# Patient Record
Sex: Male | Born: 2000 | Hispanic: No | Marital: Single | Smoking: Never smoker
Health system: Southern US, Community
[De-identification: ages and names within clinical notes are randomized; demographics above are authoritative.]

## PROBLEM LIST (undated history)

## (undated) DIAGNOSIS — L4050 Arthropathic psoriasis, unspecified: Secondary | ICD-10-CM

## (undated) HISTORY — DX: Arthropathic psoriasis, unspecified: L40.50

## (undated) HISTORY — PX: WISDOM TOOTH EXTRACTION: SHX21

---
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
## 2021-02-26 ENCOUNTER — Ambulatory Visit (HOSPITAL_COMMUNITY)
Admission: EM | Admit: 2021-02-26 | Discharge: 2021-02-26 | Disposition: A | Discharge status: Home / Self Care | Payer: Self-pay

## 2021-02-26 ENCOUNTER — Other Ambulatory Visit: Payer: Self-pay

## 2021-04-20 ENCOUNTER — Other Ambulatory Visit: Payer: Self-pay

## 2021-04-20 ENCOUNTER — Ambulatory Visit (HOSPITAL_COMMUNITY)
Admission: EM | Admit: 2021-04-20 | Discharge: 2021-04-20 | Disposition: A | Discharge status: Home / Self Care | Payer: Self-pay | Attending: Family Medicine | Admitting: Family Medicine

## 2021-04-20 ENCOUNTER — Encounter (HOSPITAL_COMMUNITY): Payer: Self-pay

## 2021-04-20 DIAGNOSIS — M7741 Metatarsalgia, right foot: Secondary | ICD-10-CM

## 2021-04-20 MED ORDER — MELOXICAM 15 MG PO TABS: 15.0000 mg | ORAL_TABLET | Freq: Every day | ORAL | 0 refills | Status: DC

## 2021-04-20 NOTE — ED Triage Notes (Signed)
Pt presents for right foot toe pain.  ?

## 2021-04-20 NOTE — ED Provider Notes (Signed)
?[facility] ? ? ? ?CSN: [id number] ?Arrival date & time: 04/20/21  1200 ? ? ?  ? ?History   ?Chief Complaint ?Chief Complaint  ?Patient presents with  ? Toe Pain  ? ? ?HPI ?Jaylon Boylen is a 21 y.o. male.  ? ?Right foot pain ?Started a few months ago ?Insidious onset ?No particular injury ?States that the pain is coming gone, but has worsened recently ?Denies any notable swelling or redness ?No fevers ?Otherwise in his normal state of health ?Mostly wears boots to work ?Pain is mostly on the plantar aspect of his foot near the second toe ?Has never had anything like this before ? ? ?History reviewed. No pertinent past medical history. ? ?Patient Active Problem List  ? Diagnosis Date Noted  ? 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 Acne vulgaris 08/21/2014  ? Nevus of face 08/21/2014  ? ? ?History reviewed. No pertinent surgical history. ? ? ? ? ?Home Medications   ? ?Prior to Admission medications   ?Medication Sig Start Date End Date Taking? Authorizing Provider  ?meloxicam (MOBIC) 15 MG tablet Take 1 tablet (15 mg total) by mouth daily. 04/20/21  Yes Calandra Madura, Bernita Raisin, DO  ?adapalene (DIFFERIN) 0.1 % cream Apply topically at bedtime. ?Patient not taking: Reported on 04/03/2016 03/16/16   Ettefagh, Paul Dykes, MD  ? ? ?Family History ?History reviewed. No pertinent family history. ? ?Social History ?Social History  ? ?Tobacco Use  ? Smokeless tobacco: Never  ?Substance Use Topics  ? Alcohol use: Not Currently  ?  Alcohol/week: 0.0 standard drinks  ? Drug use: Not Currently  ? ? ? ?Allergies   ?Patient has no known allergies. ? ? ?Review of Systems ?Review of Systems  ?All other systems reviewed and are negative. ?Per HPI ? ?Physical Exam ?Triage Vital Signs ?ED Triage Vitals [04/20/21 1224]  ?Enc Vitals Group  ?   BP 126/76  ?   Pulse Rate (!) 110  ?   Resp 16  ?   Temp (!) 97.4 ?F (36.3 ?C)  ?   Temp Source Oral  ?   SpO2 97 %  ?   Weight   ?   Height   ?   Head Circumference   ?   Peak Flow    ?   Pain Score   ?   Pain Loc   ?   Pain Edu?   ?   Excl. in [place]?   ? ?No data found. ? ?Updated Vital Signs ?BP 126/76 (BP Location: Left Arm)   Pulse (!) 110   Temp (!) 97.4 ?F (36.3 ?C) (Oral)   Resp 16   SpO2 97%  ? ?Visual Acuity ?Right Eye Distance:   ?Left Eye Distance:   ?Bilateral Distance:   ? ?Right Eye Near:   ?Left Eye Near:    ?Bilateral Near:    ? ?Physical Exam ?Constitutional:   ?   General: He is not in acute distress. ?   Appearance: Normal appearance. He is not ill-appearing.  ?HENT:  ?   Head: Normocephalic and atraumatic.  ?Eyes:  ?   Conjunctiva/sclera: Conjunctivae normal.  ?Cardiovascular:  ?   Rate and Rhythm: Normal rate.  ?   Comments: Pulse 90 on exam ?Pulmonary:  ?   Effort: Pulmonary effort is normal. No respiratory distress.  ?Musculoskeletal:  ?   Cervical back: Normal range of motion.  ?   Comments: Right Foot: ?Inspection: Collapse of the transverse arch, otherwise no obvious bony deformity b/l.  No swelling, erythema, or bruising b/l.  Normal arch b/l ?Palpation: Tenderness to palpation at the second and third metatarsal heads ?ROM: Full  ROM of the ankle b/l. Normal midfoot flexibility b/l ?Strength: 5/5 strength ankle in all planes b/l ?Neurovascular: N/V intact distally in the lower extremity b/l ?  ?Skin: ?   General: Skin is warm and dry.  ?Neurological:  ?   Mental Status: He is alert and oriented to person, place, and time.  ?Psychiatric:     ?   Mood and Affect: Mood normal.     ?   Behavior: Behavior normal.  ? ? ? ?UC Treatments / Results  ?Labs ?(all labs ordered are listed, but only abnormal results are displayed) ?Labs Reviewed - No data to display ? ?EKG ? ? ?Radiology ?No results found. ? ?Procedures ?Procedures (including critical care time) ? ?Medications Ordered in UC ?Medications - No data to display ? ?Initial Impression / Assessment and Plan / UC Course  ?I have reviewed the triage vital signs and the nursing notes. ? ?Pertinent labs & imaging results that  were available during my care of the patient were reviewed by me and considered in my medical decision making (see chart for details). ? ?  ? ?Most consistent with metatarsalgia.  Less likely stress fracture.  We will treat with metatarsal pads over-the-counter and meloxicam.  Recommend follow-up with sports medicine if not improving over the next few days to week. ? ? ?Final Clinical Impressions(s) / UC Diagnoses  ? ?Final diagnoses:  ?Metatarsalgia of right foot  ? ? ? ?Discharge Instructions   ? ?  ?As we discussed, you have something called metatarsalgia.  This is when the head of the long bone of your foot drops down and is getting pressure from walking.  Look up metatarsal pads on [place], you can also look for these at [place] or running stores such as [employer] or [employer].  Make sure that when you use the pad, you put it directly behind the area that is painful.  If it causes worsening of your pain, please do not use it.  I have sent a prescription to the pharmacy for an anti-inflammatory for you to take as well.  You can take this once daily as needed for pain, but make sure you do not take this with ibuprofen, Advil, or Aleve.  If you are not having improvement over the next few days, or you cannot get appropriate pads, please call the sports medicine office. ? ? ? ? ?ED Prescriptions   ? ? Medication Sig Dispense Auth. Provider  ? meloxicam (MOBIC) 15 MG tablet Take 1 tablet (15 mg total) by mouth daily. 15 tablet Bri Wakeman, Bernita Raisin, DO  ? ?  ? ?PDMP not reviewed this encounter. ?  ?Cleophas Dunker, DO ?04/20/21 1250 ? ?

## 2021-04-20 NOTE — Discharge Instructions (Addendum)
As we discussed, you have something called metatarsalgia.  This is when the head of the long bone of your foot drops down and is getting pressure from walking.  Look up metatarsal pads on [place], you can also look for these at [place] or running stores such as [employer] or [employer].  Make sure that when you use the pad, you put it directly behind the area that is painful.  If it causes worsening of your pain, please do not use it.  I have sent a prescription to the pharmacy for an anti-inflammatory for you to take as well.  You can take this once daily as needed for pain, but make sure you do not take this with ibuprofen, Advil, or Aleve.  If you are not having improvement over the next few days, or you cannot get appropriate pads, please call the sports medicine office. ?

## 2021-05-18 ENCOUNTER — Ambulatory Visit (HOSPITAL_COMMUNITY)
Admission: EM | Admit: 2021-05-18 | Discharge: 2021-05-18 | Disposition: A | Discharge status: Home / Self Care | Payer: Self-pay | Attending: Family Medicine | Admitting: Family Medicine

## 2021-05-18 ENCOUNTER — Encounter (HOSPITAL_COMMUNITY): Payer: Self-pay

## 2021-05-18 DIAGNOSIS — M7741 Metatarsalgia, right foot: Secondary | ICD-10-CM

## 2021-05-18 MED ORDER — MELOXICAM 15 MG PO TABS: 15.0000 mg | ORAL_TABLET | Freq: Every day | ORAL | 0 refills | Status: DC

## 2021-05-18 NOTE — ED Triage Notes (Signed)
Pt presents today with right pain on his toes. Pt states swelling. Pt states no injury to affected area. ?

## 2021-05-18 NOTE — Discharge Instructions (Addendum)
As we discussed, it is very unlikely that you have a stress fracture.  I recommend follow-up with the sports medicine office so that we can make sure you have the appropriate pads.  I have provided you a refill for your anti-inflammatory medicine.  ?

## 2021-05-18 NOTE — ED Provider Notes (Signed)
?[place] ? ? ? ?CSN: [id number] ?Arrival date & time: 05/18/21  1153 ? ? ?  ? ?History   ?Chief Complaint ?Chief Complaint  ?Patient presents with  ? Toe Pain  ? ? ?HPI ?Frank Leon is a 21 y.o. male.  ? ?Right foot pain ?Pain hurts plantar aspect of the second and third metatarsal heads ?Similar to when he was last seen for this a few weeks ago ?He was diagnosed with metatarsalgia at that time ?He was advised to follow-up with sports medicine, but lost the phone number to make an appointment ?States that he has been taking meloxicam and it does help his pain some ?He continues to have some pain mostly when he walks ?It does not move throughout the foot ?He did try some over-the-counter pads for metatarsalgia but does not think that these helped ?He does not currently have them in ? ? ?History reviewed. No pertinent past medical history. ? ?Patient Active Problem List  ? Diagnosis Date Noted  ? 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 Acne vulgaris 08/21/2014  ? Nevus of face 08/21/2014  ? ? ?History reviewed. No pertinent surgical history. ? ? ? ? ?Home Medications   ? ?Prior to Admission medications   ?Medication Sig Start Date End Date Taking? Authorizing Provider  ?adapalene (DIFFERIN) 0.1 % cream Apply topically at bedtime. ?Patient not taking: Reported on 04/03/2016 03/16/16   Ettefagh, Paul Dykes, MD  ?meloxicam (MOBIC) 15 MG tablet Take 1 tablet (15 mg total) by mouth daily. 05/18/21   Luc Shammas, Bernita Raisin, DO  ? ? ?Family History ?History reviewed. No pertinent family history. ? ?Social History ?Social History  ? ?Tobacco Use  ? Smoking status: Never  ? Smokeless tobacco: Never  ?Substance Use Topics  ? Alcohol use: Not Currently  ?  Alcohol/week: 0.0 standard drinks  ? Drug use: Not Currently  ? ? ? ?Allergies   ?Patient has no known allergies. ? ? ?Review of Systems ?Review of Systems  ?All other systems reviewed and are negative. ?Per HPI ? ?Physical Exam ?Triage Vital Signs ?ED  Triage Vitals [05/18/21 1242]  ?Enc Vitals Group  ?   BP   ?   Pulse   ?   Resp   ?   Temp   ?   Temp src   ?   SpO2   ?   Weight   ?   Height   ?   Head Circumference   ?   Peak Flow   ?   Pain Score 6  ?   Pain Loc   ?   Pain Edu?   ?   Excl. in [place]?   ? ?No data found. ? ?Updated Vital Signs ?BP 110/72 (BP Location: Right Arm)   Pulse 65   Temp 98.5 ?F (36.9 ?C) (Oral)   Resp 18   SpO2 98%  ? ?Visual Acuity ?Right Eye Distance:   ?Left Eye Distance:   ?Bilateral Distance:   ? ?Right Eye Near:   ?Left Eye Near:    ?Bilateral Near:    ? ?Physical Exam ?Constitutional:   ?   General: He is not in acute distress. ?   Appearance: Normal appearance. He is not ill-appearing.  ?HENT:  ?   Head: Normocephalic and atraumatic.  ?Eyes:  ?   Conjunctiva/sclera: Conjunctivae normal.  ?Cardiovascular:  ?   Rate and Rhythm: Normal rate.  ?Pulmonary:  ?   Effort: Pulmonary effort is normal. No respiratory distress.  ?Musculoskeletal:  ?  Cervical back: Normal range of motion.  ?   Comments: Right Foot: ?Inspection:  No obvious bony deformity b/l.  No swelling, erythema, or bruising b/l.  ?Palpation: He has tenderness palpation in the plantar region of the second and third metatarsal heads, none throughout the metatarsal shafts, none on the dorsal aspect, none throughout the arch ?ROM: Full  ROM of the ankle b/l.  ?Neurovascular: N/V intact distally in the lower extremity b/l ?Special tests: Negative hop test  ?Skin: ?   General: Skin is warm and dry.  ?Neurological:  ?   Mental Status: He is alert and oriented to person, place, and time.  ?Psychiatric:     ?   Mood and Affect: Mood normal.     ?   Behavior: Behavior normal.  ? ? ? ?UC Treatments / Results  ?Labs ?(all labs ordered are listed, but only abnormal results are displayed) ?Labs Reviewed - No data to display ? ?EKG ? ? ?Radiology ?No results found. ? ?Procedures ?Procedures (including critical care time) ? ?Medications Ordered in UC ?Medications - No data to  display ? ?Initial Impression / Assessment and Plan / UC Course  ?I have reviewed the triage vital signs and the nursing notes. ? ?Pertinent labs & imaging results that were available during my care of the patient were reviewed by me and considered in my medical decision making (see chart for details). ? ?  ? ?Remains very likely that this is related to metatarsalgia given the collapse of the transverse arch and the tenderness at the metatarsal heads.  Reassured that he has a negative hop test, less likely to be a stress fracture.  Also improves with meloxicam and is able to bear weight without significant difficulty.  Recommend follow-up with sports medicine and provided the phone number again.  Provided a refill for meloxicam in the meantime. ? ? ?Final Clinical Impressions(s) / UC Diagnoses  ? ?Final diagnoses:  ?Metatarsalgia of right foot  ? ? ? ?Discharge Instructions   ? ?  ?As we discussed, it is very unlikely that you have a stress fracture.  I recommend follow-up with the sports medicine office so that we can make sure you have the appropriate pads.  I have provided you a refill for your anti-inflammatory medicine.  ? ? ? ? ?ED Prescriptions   ? ? Medication Sig Dispense Auth. Provider  ? meloxicam (MOBIC) 15 MG tablet Take 1 tablet (15 mg total) by mouth daily. 15 tablet Lorann Tani, Bernita Raisin, DO  ? ?  ? ?PDMP not reviewed this encounter. ?  ?Cleophas Dunker, DO ?05/18/21 1256 ? ?

## 2021-05-27 ENCOUNTER — Ambulatory Visit (INDEPENDENT_AMBULATORY_CARE_PROVIDER_SITE_OTHER): Payer: Self-pay | Admitting: Family Medicine

## 2021-05-27 VITALS — BP 109/59 | Ht 66.0 in | Wt 160.0 lb

## 2021-05-27 DIAGNOSIS — M7741 Metatarsalgia, right foot: Secondary | ICD-10-CM

## 2021-05-27 DIAGNOSIS — M2142 Flat foot [pes planus] (acquired), left foot: Secondary | ICD-10-CM

## 2021-05-27 DIAGNOSIS — M2141 Flat foot [pes planus] (acquired), right foot: Secondary | ICD-10-CM

## 2021-05-27 NOTE — Progress Notes (Signed)
PCP: System, Provider Not In ? ?Subjective:  ? ?HPI: ?Frank Leon is a 21 y.o. male here for right metatarsal pain. ? ?Patient has pain on the distal part of the right plantar foot over the metatarsals.  He is tender near the metatarsals 2 and 3.  This has been ongoing since the end of January.  He denies any inciting event or trauma.  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  He does report some localized swelling at times, no erythema, bruising.  He has been to the urgent care twice before this and was diagnosed with metatarsalgia.  He has had some improvement with meloxicam, he tried some over-the-counter pads for metatarsalgia but these irritated the toes and he did not think they helped.  His left foot is not bothering him today. ? ?No past medical history on file. ? ?Current Outpatient Medications on File Prior to Visit  ?Medication Sig Dispense Refill  ? adapalene (DIFFERIN) 0.1 % cream Apply topically at bedtime. (Patient not taking: Reported on 04/03/2016) 45 g 5  ? meloxicam (MOBIC) 15 MG tablet Take 1 tablet (15 mg total) by mouth daily. 15 tablet 0  ? ?No current facility-administered medications on file prior to visit.  ? ? ?No past surgical history on file. ? ?No Known Allergies ? ?BP (!) 109/59   Ht '5\' 6"'$  (1.676 m)   Wt 160 lb (72.6 kg)   BMI 25.82 kg/m?  ? ? ?  05/27/2021  ? 10:26 AM  ?[place] Adult Exercise  ?Frequency of aerobic exercise (# of days/week) 5  ?Average time in minutes 40  ?Frequency of strengthening activities (# of days/week) 5  ? ? ?   ? View : No data to display.  ?  ?  ?  ? ? ?    ?Objective:  ?Physical Exam: ? ?Gen: Well-appearing, in no acute distress; non-toxic ?CV: Regular Rate. Well-perfused. Warm.  ?Resp: Breathing unlabored on room air; no wheezing. ?Psych: Fluid speech in conversation; appropriate affect; normal thought process ?Neuro: Sensation intact throughout. No gross coordination deficits.  ?MSK:  ?- Right foot: + TTP over  metatarsals 2 and 3 on the plantar aspect of the foot.  Inspection yields no erythema, ecchymosis.  There may be some trace soft tissue swelling the dorsum and plantar aspect of the metatarsals.  Full range of motion of the ankles.  Able to wiggle all 5 toes active and passively without pain.  There is pain over the metatarsal area with standing and heel raises.  Normal inversion/eversion stress testing.  Negative Morton's squeeze.  5/5 strength of the ankle and foot.  Neurovascular intact distally. ? ?- Bilateral foot: There is moderate flexible pes planus.  He does have a slight degree of first toe insufficiency with the start of early Morton's callus more so on the right foot.  Overpronation through walking phase. ?  ?Assessment & Plan:  ?1. Metatarsalgia, right foot ?2. Pes planus bilaterally ? ?Patient presents with metatarsalgia of the right foot.  He does have notable pes planus bilaterally.  We fit the patient for bilateral green sports insoles with medium scaphoid pad bilaterally and medium metatarsal pads.  We did have him stand on these insoles and he found them to be comfortable with appropriate location of the pad.  Unfortunately, he came in sandals today and so we were not able to test these in his shoes.  We discussed moving these insoles into any shoes or boots he wears for work  or when ambulating over the next 2-3 weeks and see if this helps improve his foot pain.  Discussed avoiding barefoot walking. He may continue the meloxicam as needed.  He will follow-up as needed. ? ?Elba Barman, DO ?PGY-4, Sports Medicine Fellow ?[place] ? ?This note was dictated using Dragon naturally speaking software and may contain errors in syntax, spelling, or content which have not been identified prior to signing this note.  ? ? ?

## 2021-05-30 NOTE — Progress Notes (Signed)
[facility]: Attending Note: I have reviewed the chart, discussed wit the Sports Medicine Fellow. I agree with assessment and treatment plan as detailed in the Fellow's note.  

## 2021-06-22 ENCOUNTER — Ambulatory Visit
Admission: RE | Admit: 2021-06-22 | Discharge: 2021-06-22 | Disposition: A | Discharge status: Home / Self Care | Payer: No Typology Code available for payment source | Source: Ambulatory Visit | Attending: Sports Medicine | Admitting: Sports Medicine

## 2021-06-22 ENCOUNTER — Ambulatory Visit (INDEPENDENT_AMBULATORY_CARE_PROVIDER_SITE_OTHER): Payer: Self-pay | Admitting: Sports Medicine

## 2021-06-22 VITALS — BP 129/72 | Ht 67.0 in | Wt 150.0 lb

## 2021-06-22 DIAGNOSIS — M2141 Flat foot [pes planus] (acquired), right foot: Secondary | ICD-10-CM

## 2021-06-22 DIAGNOSIS — M7741 Metatarsalgia, right foot: Secondary | ICD-10-CM

## 2021-06-22 DIAGNOSIS — M79672 Pain in left foot: Secondary | ICD-10-CM

## 2021-06-22 DIAGNOSIS — M2142 Flat foot [pes planus] (acquired), left foot: Secondary | ICD-10-CM

## 2021-06-22 MED ORDER — PREDNISONE 10 MG PO TABS: ORAL_TABLET | ORAL | 0 refills | Status: DC

## 2021-06-22 NOTE — Progress Notes (Signed)
PCP: System, Provider Not In ? ?Subjective:  ? ?HPI: ?Nnamdi is a pleasant 21 y.o. male here for left foot pain. ? ?He was seen previously by myself on 05/27/2021 for right metatarsal pain.  At that visit we did provide him green sports insoles with medium scaphoid pad and metatarsal pads bilaterally. These did provide good relief.  ? ?Today, he is having left foot/2nd toe pain and swelling.  The pain is located both on the plantar aspect of the second toe as well as over the metatarsals on the plantar aspect of the foot.  He does feel like the second toe is slightly swollen.  He denies any redness or erythema.  No fever or chills.  No increased warmth in that area he does note that after he got the green sports insoles with padding he did have a good bit of relief for a little while, but feels like he is wearing down his pads.  His right foot is feeling better since our last visit.  He denies any new injury.  He does state that he works in stiff toed work boots, he does use a wider toe box to not squish the toes.  He denies any numbness tingling or clicking sensation about the foot or metatarsals. ? ?BP 129/72   Ht '5\' 7"'$  (1.702 m)   Wt 150 lb (68 kg)   BMI 23.49 kg/m?  ? ? ?  05/27/2021  ? 10:26 AM  ?[place] Adult Exercise  ?Frequency of aerobic exercise (# of days/week) 5  ?Average time in minutes 40  ?Frequency of strengthening activities (# of days/week) 5  ? ? ?   ? View : No data to display.  ?  ?  ?  ? ? ?    ?Objective:  ?Physical Exam: ? ?Gen: Well-appearing, in no acute distress; non-toxic ?CV: Regular Rate. Well-perfused. Warm.  ?Resp: Breathing unlabored on room air; no wheezing. ?Psych: Fluid speech in conversation; appropriate affect; normal thought process ?Neuro: Sensation intact throughout. No gross coordination deficits.  ?MSK:  ? ?- Bilateral feet: There is moderate pes planus bilaterally.  Bilateral first toe insufficiency with early Morton's callus on both feet, although right  worse than left.  Moderate hyperpronation without insoles, this does correct better in his sports insoles and shoes.  There is no overlying erythema or ecchymosis.  The right foot he is tender to palpation on the plantar aspect of the second toe near the IP joint.  He is also tender to palpation over the second and third metatarsal on the plantar aspect of the foot.  He has full range of motion about the ankle and toes.  Normal inversion/eversion stress testing.  Negative Morton's squeeze.  Neurovascular intact distally.  Strength 5/5 at the ankle and foot. ?  ?Assessment & Plan:  ?1. Pes planus, bilateral ?2. Metatarsalgia, right foot ?3. Left 2nd toe pain ? ?He is still dealing with metatarsalgia of both feet, although this has improved with the sports insoles and the metatarsal pads.  He does have some soft tissue swelling of the second left toe which could be indicative of a mild ligament/toe plate sprain vs. biomechanical sequelae of being on his feet with his pes planus. ? ?- metatarsal cookie pads bilaterally today ?- x-ray of feet to rule-out any bony pathology given chronicity of pain ?- ice the feet, rest/activity modification as able  ?- 6-day prednisone '10mg'$  taper for acute inflammation ?- may be candidate for custom orthotics for more support  as he is already breaking down his green sports insoles given his high-work load and being on his feet all day ?- follow-up in 3-4 weeks if not improving ? ?Elba Barman, DO ?PGY-4, Sports Medicine Fellow ?[place] ? ?This note was dictated using Dragon naturally speaking software and may contain errors in syntax, spelling, or content which have not been identified prior to signing this note.  ? ?Addendum:  I was the preceptor for this visit and available for immediate consultation.  Karlton Lemon MD [facility] ? ?

## 2021-07-01 ENCOUNTER — Telehealth: Payer: Self-pay | Admitting: Sports Medicine

## 2021-07-01 NOTE — Telephone Encounter (Signed)
I called the patient, he answered and confirmed identity. Discussed his x-ray results which show no acute fracture or malalignment. Did have some mild osteopenia near MTP joints of 2-4 on the right foot. Unsure the exact cause of this -- did not seem indicative of joint destruction or [place]'s, etc. He feels much better after the prednisone and metatarsal pad correction.   Discussed with him personal or family history of rheumatologic conditions. His maternal aunt has lupus, but no other known family members.  We will see how he does in 3-4 weeks, encouraged to call back if this bothers him again. If so, we may concern baseline labs for inflammatory markers and possibly making custom orthotics. MRI of the foot would be possible, but given the patient is self-pay, would hold on this for now. Patient is agreeable and understanding.  Elba Barman, DO

## 2021-11-17 ENCOUNTER — Telehealth: Payer: Self-pay | Admitting: Sports Medicine

## 2021-11-17 NOTE — Telephone Encounter (Signed)
Pt's mom cld to tentatively schedule appt but date --will cb when pt's is available -

## 2021-11-24 ENCOUNTER — Ambulatory Visit (INDEPENDENT_AMBULATORY_CARE_PROVIDER_SITE_OTHER): Payer: Self-pay | Admitting: Sports Medicine

## 2021-11-24 VITALS — BP 120/70 | Ht 67.0 in | Wt 160.0 lb

## 2021-11-24 DIAGNOSIS — M7989 Other specified soft tissue disorders: Secondary | ICD-10-CM

## 2021-11-24 MED ORDER — MELOXICAM 15 MG PO TABS: ORAL_TABLET | ORAL | 2 refills | Status: DC

## 2021-11-24 NOTE — Progress Notes (Signed)
    SUBJECTIVE:   CHIEF COMPLAINT / HPI:   Patient is a 21 year old male who presents today due to right toe swellings. This has been ongoing for about 8 months to a year. Initially presented on second toe of the left foot however in the last 6 months has noticed swelling on the second to the fifth toe of the right foot. The swelling started on the second toe and gradually patient started noticing swelling on the third, fourth and fifth toe on the right foot. He does endorse pain in the toes which cause him to limp at times. Patient reports pain usually improved with rest however swelling never improves. On his visit in May for left second toe swelling he was given a shoe insert and prednisone which patient said improved pain and swelling but was never completely resolved. He works as a Marketing executive at work.  Recently started wearing "hey-daddy shoes" due to pain and discomfort. X-ray of his foot 5 months ago showed osteopenia but was negative for any fractures or dislocation.  PERTINENT  PMH / PSH: Reviewed  OBJECTIVE:   BP 120/70   Ht '5\' 7"'$  (1.702 m)   Wt 160 lb (72.6 kg)   BMI 25.06 kg/m     Physical Exam  General:  Alert, well appearing, NAD  Right toe Notable edema of the contused fifth right toe.  No gross deformity or erythema Mild tenderness over the right 2nd-5th toe Normal ROM with strength    ASSESSMENT/PLAN:   Patient's right toe swellings is likely dactylitis. Ordered lab for CBC, CMP, CRP, sed rate, ANA and rheumatoid factor.  Provided patient with metatarsal cookie pads and recommend daily meloxicam.  Follow-up in 2 weeks for reevaluation and consideration for MRI.  Alen Bleacher, MD [place]   Patient seen and evaluated with the resident.  I agree with the above plan of care.  Presentation suggests dactylitis of his toes.  We will order the above blood work he will follow-up  with me in 2 weeks for reevaluation.  We will also start meloxicam 15 mg daily and I will give him some new metatarsal cookies on green inserts since these have helped provide him with some pain relief in the past.  We did discuss merits of MRI scan at follow-up if blood work is unremarkable and symptoms are not improving.  This note was dictated using Dragon naturally speaking software and may contain errors in syntax, spelling, or content which have not been identified prior to signing this note.

## 2021-11-24 NOTE — Patient Instructions (Addendum)
It was wonderful to meet you today. Thank you for allowing me to be a part of your care. Below is a short summary of what we discussed at your visit today:  Swelling on your toes is likely due to dactylitis which is an inflammatory process.  Sent in prescription for meloxicam (anti Inflammatory meds)  which you should take daily.  We provided you with shoe inserts to support you feet and ordered labs today to further assess cause of your toe swellings. Will go over lab result at your next visit.  Follow-up in 2 weeks and at that time we could consider obtaining an MRI.  If you have any questions or concerns, please do not hesitate to contact us via phone or MyChart message.   Alen Bleacher, MD [facility]

## 2021-11-25 LAB — CBC
Hematocrit: 42.4 % (ref 37.5–51.0)
Hemoglobin: 14.4 g/dL (ref 13.0–17.7)
MCH: 29.8 pg (ref 26.6–33.0)
MCHC: 34 g/dL (ref 31.5–35.7)
MCV: 88 fL (ref 79–97)
Platelets: 323 10*3/uL (ref 150–450)
RBC: 4.83 x10E6/uL (ref 4.14–5.80)
RDW: 12.9 % (ref 11.6–15.4)
WBC: 5.8 10*3/uL (ref 3.4–10.8)

## 2021-11-25 LAB — COMPREHENSIVE METABOLIC PANEL
ALT: 29 IU/L (ref 0–44)
AST: 22 IU/L (ref 0–40)
Albumin/Globulin Ratio: 1.8 (ref 1.2–2.2)
Albumin: 4.7 g/dL (ref 4.3–5.2)
Alkaline Phosphatase: 110 IU/L (ref 44–121)
BUN/Creatinine Ratio: 23 — ABNORMAL HIGH (ref 9–20)
BUN: 14 mg/dL (ref 6–20)
Bilirubin Total: 0.5 mg/dL (ref 0.0–1.2)
CO2: 23 mmol/L (ref 20–29)
Calcium: 9.3 mg/dL (ref 8.7–10.2)
Chloride: 101 mmol/L (ref 96–106)
Creatinine, Ser: 0.62 mg/dL — ABNORMAL LOW (ref 0.76–1.27)
Globulin, Total: 2.6 g/dL (ref 1.5–4.5)
Glucose: 93 mg/dL (ref 70–99)
Potassium: 4.5 mmol/L (ref 3.5–5.2)
Sodium: 138 mmol/L (ref 134–144)
Total Protein: 7.3 g/dL (ref 6.0–8.5)
eGFR: 139 mL/min/{1.73_m2} (ref >59)

## 2021-11-25 LAB — C-REACTIVE PROTEIN: CRP: 3 mg/L (ref 0–10)

## 2021-11-25 LAB — SEDIMENTATION RATE: Sed Rate: 19 mm/hr — ABNORMAL HIGH (ref 0–15)

## 2021-11-25 LAB — ANA: Anti Nuclear Antibody (ANA): NEGATIVE

## 2021-11-25 LAB — RHEUMATOID FACTOR: Rhuematoid fact SerPl-aCnc: <10 IU/mL (ref <14.0)

## 2021-12-08 ENCOUNTER — Ambulatory Visit (INDEPENDENT_AMBULATORY_CARE_PROVIDER_SITE_OTHER): Payer: Self-pay | Admitting: Sports Medicine

## 2021-12-08 VITALS — BP 110/60 | Ht 66.0 in | Wt 165.0 lb

## 2021-12-08 DIAGNOSIS — M79671 Pain in right foot: Secondary | ICD-10-CM

## 2021-12-08 NOTE — Progress Notes (Signed)
   Subjective:    Patient ID: Frank Leon, male    DOB: 16-Sep-2000, 21 y.o.   MRN: [id number]  HPI  Patient presents today for follow-up on right foot swelling.  Unfortunately, swelling persists in his second through fifth toes.  He also has persistent swelling in the second toe of the left foot.  Meloxicam and metatarsal cookies have been helpful.  His pain has improved by about 70% but his swelling persists.  His pain is most noticeable with toe flexion.  Recent blood work did not reveal an obvious rheumatological cause for his swelling.  Previous x-rays have been unremarkable.    Review of Systems As above    Objective:   Physical Exam  Developed, well nourished.  No acute distress  Right foot: There continues to be diffuse swelling of the second through fifth toes.  They are not erythematous.  Not tender to palpation.  No other swelling appreciated through the rest of the foot.  Left foot: There is swelling of the second toe diffusely.  No erythema.  Not tender to palpation.      Assessment & Plan:   Bilateral toe dactylitis  Etiology for his dactylitis is not straightforward.  His blood work was fairly unremarkable.  At this point we will proceed with an MRI scan of his foot to rule out causes such as stress fracture.  He will follow-up with me in the office after the study.  If the MRI only shows diffuse soft tissue swelling in the toes then a rheumatology consultation may be warranted even though he has no obvious markers for rheumatological disease.  He may continue on his meloxicam and we will give him a couple of new pair as of metatarsal cookies.  This note was dictated using Dragon naturally speaking software and may contain errors in syntax, spelling, or content which have not been identified prior to signing this note.

## 2021-12-18 ENCOUNTER — Ambulatory Visit
Admission: RE | Admit: 2021-12-18 | Discharge: 2021-12-18 | Disposition: A | Discharge status: Home / Self Care | Payer: Medicaid Other | Source: Ambulatory Visit | Attending: Sports Medicine | Admitting: Sports Medicine

## 2021-12-18 DIAGNOSIS — M79671 Pain in right foot: Secondary | ICD-10-CM

## 2021-12-21 ENCOUNTER — Ambulatory Visit (INDEPENDENT_AMBULATORY_CARE_PROVIDER_SITE_OTHER): Payer: Self-pay | Admitting: Sports Medicine

## 2021-12-21 ENCOUNTER — Encounter: Payer: Self-pay | Admitting: Sports Medicine

## 2021-12-21 VITALS — BP 100/70 | Ht 66.0 in

## 2021-12-21 DIAGNOSIS — M7989 Other specified soft tissue disorders: Secondary | ICD-10-CM

## 2021-12-21 DIAGNOSIS — L4050 Arthropathic psoriasis, unspecified: Secondary | ICD-10-CM | POA: Insufficient documentation

## 2021-12-21 NOTE — Progress Notes (Signed)
   Established Patient Office Visit  Subjective   Patient ID: Frank Leon, male    DOB: [birth year]  Age: 21 y.o. MRN: [id number]  Follow-up right foot swelling.  He presents today with his girlfriend for follow-up of MRI for persistent right foot pain and swelling.  At time of his visit his MRI official radiology read was not yet available.  He reports that he has been taking the meloxicam that we prescribed previously for him regularly with some relief of his pain however he still reports persistent swelling.  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  He is considering getting some custom boots made as he has had family members say that these have helped their feet.  His lab work that was previously ordered was relatively unremarkable.  Ever so slightly elevated ESR, 19. MRI right foot, performed 12/18/2021 results returned which showed second through fourth MTP joint and second and third IP joint effusions with mild synovitis.  Findings are nonspecific but may be seen in the setting of inflammatory arthropathy.    ROS as listed above in HPI    Objective:     BP 100/70   Ht _0  (1.676 m)   BMI 26.63 kg/m   Physical Exam Vitals reviewed.  Constitutional:      General: He is not in acute distress.    Appearance: Normal appearance. He is not ill-appearing, toxic-appearing or diaphoretic.  Pulmonary:     Effort: Pulmonary effort is normal.  Neurological:     Mental Status: He is alert.   Right foot: No obvious deformity or asymmetry.  Pes planus.  There is some slight discoloration or pigmentation of his second third and fourth phalanx mild swelling over the distal metatarsals.  He has some tenderness to palpation at the distal portion of the second and fourth MTP.  Full range of motion plantarflexion, dorsiflexion, inversion and eversion.  Normal cap refill digits.    Assessment & Plan:   Problem List Items Addressed This Visit       Other    Foot swelling - Primary    Patient continues to have swelling and pain of the right foot without injury.  Meloxicam does help with the pain moderately.  We discussed continuing meloxicam as needed.  MRI reviewed with patient over the phone. Joint effusion of the second third and fourth MTPs as well as third and fourth IP joint effusions. Grossly normal labs except for slightly elevated sed rate at 19 Referral was placed today for rheumatology.  Some suspicion for seronegative spondyloarthropathy       Return if symptoms worsen or fail to improve.    Frank Guise, DO  Addendum:  Patient seen in the office by fellow.  Her history, exam, plan of care were precepted with me.  Karlton Lemon MD [place]

## 2021-12-21 NOTE — Assessment & Plan Note (Addendum)
Patient continues to have swelling and pain of the right foot.  Meloxicam does help with the pain moderately.  We discussed continuing meloxicam as needed.  MRI reviewed with patient over the phone. Joint effusion of the second third and fourth MTP as well as third and fourth IP joint effusions. Grossly normal labs except for slightly elevated sed rate at 19 Referral was placed today for rheumatology

## 2022-01-26 ENCOUNTER — Emergency Department (HOSPITAL_BASED_OUTPATIENT_CLINIC_OR_DEPARTMENT_OTHER)
Admission: EM | Admit: 2022-01-26 | Discharge: 2022-01-26 | Disposition: A | Discharge status: Home / Self Care | Payer: Medicaid Other | Attending: Emergency Medicine | Admitting: Emergency Medicine

## 2022-01-26 ENCOUNTER — Other Ambulatory Visit: Payer: Self-pay

## 2022-01-26 DIAGNOSIS — M7989 Other specified soft tissue disorders: Secondary | ICD-10-CM | POA: Insufficient documentation

## 2022-01-26 MED ORDER — MELOXICAM 15 MG PO TABS: 15.0000 mg | ORAL_TABLET | Freq: Every day | ORAL | 0 refills | Status: DC

## 2022-01-26 NOTE — Discharge Instructions (Addendum)
Your evaluated in the emergency department today for continuous right foot pain and swelling.  Please continue with your rheumatology follow-up as planned.  I would also recommend following back up with your PCP and the sports medicine/foot specialist for continued management until you are able to see rheumatology.  A prescription for Mobic has been sent to your pharmacy.  Please take as directed.  Return to the ED for new or worsening symptoms as discussed.

## 2022-01-26 NOTE — ED Provider Notes (Signed)
[facility] EMERGENCY DEPARTMENT Provider Note   CSN: [id number] Arrival date & time: 01/26/22  1043     History  Chief Complaint  Patient presents with   Foot Pain    Frank Leon is a 21 y.o. male presenting to the ED with right foot swelling.  This has been ongoing for the last year.  Has been seeing sports medicine/foot specialist and urgent care.  Had X-ray, CT, and MRI imaging done which was non-concerning.  Was referred to be further evaluated/treated by rheumatology.  Has appointment for January/February.  Notes foot is still painful and does not think he can wait until then.  Reports some improvement with anti-inflammatories such as mobic.  States he does not want to become "dependent on pills".  No preceding injury, and no changes since last visit 12/21/21.  Denies fever, chills, numbness, tingling, or weakness.  The history is provided by the patient and medical records.  Foot Pain     Home Medications Prior to Admission medications   Medication Sig Start Date End Date Taking? Authorizing Provider  adapalene (DIFFERIN) 0.1 % cream Apply topically at bedtime. Patient not taking: Reported on 04/03/2016 03/16/16   Ettefagh, Paul Dykes, MD  meloxicam (MOBIC) 15 MG tablet Take 1 tablet (15 mg total) by mouth daily. 52/84/13   Prince Rome, PA-C  predniSONE (DELTASONE) 10 MG tablet Use as directed per doctors orders. 06/22/21   Elba Barman, DO      Allergies    Patient has no known allergies.    Review of Systems   Review of Systems  Musculoskeletal:        Foot swelling/pain    Physical Exam Updated Vital Signs BP 122/61   Pulse 77   Temp 98.6 F (37 C) (Oral)   Resp 16   SpO2 99%  Physical Exam Vitals and nursing note reviewed.  Constitutional:      General: He is not in acute distress.    Appearance: He is well-developed. He is not ill-appearing, toxic-appearing or diaphoretic.  HENT:     Head: Normocephalic and atraumatic.   Eyes:     Conjunctiva/sclera: Conjunctivae normal.  Cardiovascular:     Rate and Rhythm: Normal rate and regular rhythm.     Pulses:          Dorsalis pedis pulses are 2+ on the right side and 2+ on the left side.       Posterior tibial pulses are 2+ on the right side and 2+ on the left side.     Heart sounds: No murmur heard. Pulmonary:     Effort: Pulmonary effort is normal. No respiratory distress.     Breath sounds: Normal breath sounds.  Abdominal:     Palpations: Abdomen is soft.     Tenderness: There is no abdominal tenderness.  Musculoskeletal:        General: Swelling present.     Cervical back: Neck supple.     Comments: ROM and strength of bilateral feet and digits appears grossly intact.   Mildly antalgic gait.  Mild swelling of digits of right foot and 1st-2nd digit of left foot.  Skin:    General: Skin is warm and dry.     Capillary Refill: Capillary refill takes less than 2 seconds.  Neurological:     Mental Status: He is alert.     Comments: Sensation appears grossly intact of lower extremities.  Psychiatric:        Mood and Affect: Mood  normal.     ED Results / Procedures / Treatments   Labs (all labs ordered are listed, but only abnormal results are displayed) Labs Reviewed - No data to display  EKG None  Radiology No results found.  Procedures Procedures    Medications Ordered in ED Medications - No data to display  ED Course/ Medical Decision Making/ A&P                           Medical Decision Making Risk Prescription drug management.   21 y.o. male presents to the ED for concern of Foot Pain   This involves an extensive number of treatment options, and is a complaint that carries with it a high risk of complications and morbidity.     Past Medical History / Co-morbidities / Social History: Hx of metatarsalgia Social Determinants of Health include: None  Additional History:  Obtained by chart review.  Notably extensive sports  medicine visit reviews. 12/21/21 Sports Medicine Dr. Gerline Legacy note states: "Patient continues to have swelling and pain of the right foot.  Meloxicam does help with the pain moderately.  We discussed continuing meloxicam as needed.  MRI reviewed with patient over the phone.  Joint effusion of the second third and fourth MTP as well as third and fourth IP joint effusions.  Grossly normal labs except for slightly elevated sed rate at 19.  Referral was placed today for rheumatology" "Ever so slightly elevated ESR, 19. MRI right foot, performed 12/18/2021 results returned which showed second through fourth MTP joint and second and third IP joint effusions with mild synovitis. Findings are nonspecific but may be seen in the setting of inflammatory arthropathy."  Lab Tests: None  Imaging Studies: None  ED Course: Pt well-appearing on exam.  Presenting with right foot swelling over the last year.  Has been seeing sports medicine/foot specialist and urgent care.  Had X-ray, CT, and MRI imaging done which was non-concerning.  See note references above.  Was then referred to be further evaluated/treated by rheumatology, with upcoming appointment for January/February.  Notes foot is still painful and does not think he can wait until then.  Reports some improvement with anti-inflammatories such as Mobic, and has not taken this recently.  No preceding injury, and no changes since last visit 12/21/21.  No fever, chills, numbness, tingling, or weakness. Low concern for acute fracture.  Sensation, ROM, and strength of right foot and digits appear grossly intact.  Mild swelling appreciated of toes on bilateral feet, without any significant changes since image reviewed from 11/24/2021.  Considered imaging, however recent MRI imaging reviewed and without recent changes since imaging.  Agree this is likely inflammatory in nature.  Remaining exam unremarkable.  Agree with rheumatology referral.  Recommend short course of Mobic  and follow up/management with PCP/Sports medicine until able to see rheumatology.  Discussed the nature of Mobic in detail and low likelihood for addiction with it's use, especially since it is not a narcotic pain medicine.  Discussed risks of NSAID use.  Pt appears comforted by this and demonstrates understanding.  Refill sent to pharmacy.  Also recommend limited use of tight shoes and limited standing on feet throughout day as this in combination may be worsening symptoms.  Strict return precautions discussed.  Patient in NAD and in good condition at time of discharge.  Disposition: After consideration the patient's encounter today, I do not feel today's workup suggests an emergent condition requiring admission or  immediate intervention beyond what has been performed at this time.  Safe for discharge; instructed to return immediately for worsening symptoms, change in symptoms or any other concerns.  I have reviewed the patients home medicines and have made adjustments as needed.  Discussed course of treatment with the patient, whom demonstrated understanding.  Patient in agreement and has no further questions.    This chart was dictated using voice recognition software.  Despite best efforts to proofread, errors can occur which can change the documentation meaning.         Final Clinical Impression(s) / ED Diagnoses Final diagnoses:  Foot swelling    Rx / DC Orders ED Discharge Orders          Ordered    meloxicam (MOBIC) 15 MG tablet  Daily        01/26/22 1527              Prince Rome, [place] [birth year] 1114    Leanord Asal K, DO 01/31/22 1500

## 2022-01-26 NOTE — ED Triage Notes (Signed)
Patient presents to ED via POV from home. Here with right foot pain x 1 year. Endorses swelling to same. Ambulatory.

## 2022-01-31 ENCOUNTER — Ambulatory Visit (INDEPENDENT_AMBULATORY_CARE_PROVIDER_SITE_OTHER): Payer: Self-pay | Admitting: Family Medicine

## 2022-01-31 ENCOUNTER — Encounter: Payer: Self-pay | Admitting: Family Medicine

## 2022-01-31 VITALS — BP 114/73 | HR 68 | Ht 66.0 in | Wt 172.8 lb

## 2022-01-31 DIAGNOSIS — M7989 Other specified soft tissue disorders: Secondary | ICD-10-CM

## 2022-01-31 DIAGNOSIS — L819 Disorder of pigmentation, unspecified: Secondary | ICD-10-CM

## 2022-01-31 NOTE — Assessment & Plan Note (Signed)
Patient has had extensive work-up for MSK causes with sports medicine. Does note improvement with Meloxicam. Has been referred to rheumatology already. Given this newer discoloration and the duration of symptoms, do wonder if there is a vasculitis or similar problem occurring as the presentation is very odd, especially given the patient's age. Reassuringly seems to have good vascular supply in the toes but patient concerned about the progression and the affect on his functionality. - Referral to vascular surgery placed

## 2022-01-31 NOTE — Progress Notes (Signed)
   Subjective:    Patient ID: Frank Leon, male    DOB: 2000/10/12, 21 y.o.   MRN: [id number]   CC: New Patient  HPI:  Right foot pain - Has been occurring for the last year with no known trauma (played soccer in the past but no injury noted)  - Initially presented to sports medicine due to pain and swelling of a single toe on his foot - Now symptoms have progressed to the point of affecting all but the great toe on the right foot - Has pain that limits ambulation (causing a limp) - Feels that it is getting worse and spreading - Has discoloration of the toes and now on top of the foot as well.  - Has been evaluated with extensive imaging ([place], MRI, X-rays) and labs which have only shown a slightly increased ESR and osteopenia of the foot   PMHx: No past medical history on file.   Surgical Hx: No past surgical history on file.   Family Hx: Family History  Problem Relation Age of Onset   Hyperlipidemia Mother      Social Hx: Who would speak for you about health care matters: mother 01/31/2022  Transportation: owns a car  Work / Education:  works in Architect    Medications: Meloxicam 37m daily   Smoking status reviewed and negative  Objective:  BP 114/73   Pulse 68   Ht _0  (1.676 m)   Wt 172 lb 12.8 oz (78.4 kg)   SpO2 100%   BMI 27.89 kg/m  Vitals and nursing note reviewed  General: NAD, well-appearing, well-nourished Respiratory: No respiratory distress, breathing comfortably, able to speak in full sentences Psych: Appropriate affect and mood Skin: warm and dry, purple discoloration noted on patient's right middle 3 toes with some discoloration also noted proximal to the right great toe. Palpable pulses present and non-tender to palpation and without breaks in the skin. See pictures below.    Assessment & Plan:    Foot swelling Patient has had extensive work-up for MSK causes with sports medicine. Does note improvement with  Meloxicam. Has been referred to rheumatology already. Given this newer discoloration and the duration of symptoms, do wonder if there is a vasculitis or similar problem occurring as the presentation is very odd, especially given the patient's age. Reassuringly seems to have good vascular supply in the toes but patient concerned about the progression and the affect on his functionality. - Referral to vascular surgery placed    Return if symptoms worsen or fail to improve.   Frank Istre, DO

## 2022-01-31 NOTE — Patient Instructions (Signed)
I put in the referral to vascular surgery. If you don't hear back in the next 2-3 weeks (beginning of the new year) call our office and see if the referral has been accepted and you can call their office and make an appointment sooner.

## 2022-02-10 ENCOUNTER — Other Ambulatory Visit: Payer: Self-pay | Admitting: *Deleted

## 2022-02-10 DIAGNOSIS — M7989 Other specified soft tissue disorders: Secondary | ICD-10-CM

## 2022-02-14 ENCOUNTER — Ambulatory Visit (INDEPENDENT_AMBULATORY_CARE_PROVIDER_SITE_OTHER): Payer: Self-pay | Admitting: Vascular Surgery

## 2022-02-14 ENCOUNTER — Ambulatory Visit (HOSPITAL_COMMUNITY)
Admission: RE | Admit: 2022-02-14 | Discharge: 2022-02-14 | Disposition: A | Discharge status: Home / Self Care | Payer: Medicaid Other | Source: Ambulatory Visit | Attending: Vascular Surgery | Admitting: Vascular Surgery

## 2022-02-14 ENCOUNTER — Encounter: Payer: Self-pay | Admitting: Vascular Surgery

## 2022-02-14 VITALS — BP 119/78 | HR 58 | Temp 98.1°F | Ht 66.0 in | Wt 176.0 lb

## 2022-02-14 DIAGNOSIS — M7989 Other specified soft tissue disorders: Secondary | ICD-10-CM

## 2022-02-14 NOTE — Progress Notes (Signed)
Patient name: Frank Leon. MRN: [id number] DOB: 2000/03/10 Sex: male  REASON FOR CONSULT: Pain and swelling in toes  HPI: Frank Till. is a 22 y.o. male, with no significant medical history that presents for evaluation of pain and swelling in his toes.  This is particularly in the right second through fifth toes as well as the left second toe.  This occurs on daily basis.  It has been ongoing for the last year at least.  He denies any associated trauma.  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  He does not smoke.  He has no swelling in the foot or ankle just the specific toes as mentioned above.  No active tissue loss.    No past medical history on file.  No past surgical history on file.  Family History  Problem Relation Age of Onset   Hyperlipidemia Mother     SOCIAL HISTORY: Social History   Socioeconomic History   Marital status: Single    Spouse name: Not on file   Number of children: Not on file   Years of education: Not on file   Highest education level: Not on file  Occupational History   Not on file  Tobacco Use   Smoking status: Never   Smokeless tobacco: Never  Substance and Sexual Activity   Alcohol use: Not Currently    Alcohol/week: 0.0 standard drinks of alcohol   Drug use: Not Currently   Sexual activity: Yes  Other Topics Concern   Not on file  Social History Narrative   ** Merged History Encounter **       Social Determinants of Health   Financial Resource Strain: Not on file  Food Insecurity: Not on file  Transportation Needs: Not on file  Physical Activity: Not on file  Stress: Not on file  Social Connections: Not on file  Intimate Partner Violence: Not on file    No Known Allergies  Current Outpatient Medications  Medication Sig Dispense Refill   meloxicam (MOBIC) 15 MG tablet Take 1 tablet (15 mg total) by mouth daily. 15 tablet 0   No current facility-administered  medications for this visit.    REVIEW OF SYSTEMS:  '[X]'$  denotes positive finding, '[ ]'$  denotes negative finding Cardiac  Comments:  Chest pain or chest pressure:    Shortness of breath upon exertion:    Short of breath when lying flat:    Irregular heart rhythm:        Vascular    Pain in calf, thigh, or hip brought on by ambulation:    Pain in feet at night that wakes you up from your sleep:     Blood clot in your veins:    Leg swelling:         Pulmonary    Oxygen at home:    Productive cough:     Wheezing:         Neurologic    Sudden weakness in arms or legs:     Sudden numbness in arms or legs:     Sudden onset of difficulty speaking or slurred speech:    Temporary loss of vision in one eye:     Problems with dizziness:         Gastrointestinal    Blood in stool:     Vomited blood:         Genitourinary    Burning when urinating:     Blood  in urine:        Psychiatric    Major depression:         Hematologic    Bleeding problems:    Problems with blood clotting too easily:        Skin    Rashes or ulcers:        Constitutional    Fever or chills:      PHYSICAL EXAM: Vitals:   02/14/22 1032  BP: 119/78  Pulse: (!) 58  Temp: 98.1 F (36.7 C)  TempSrc: Temporal  SpO2: 98%  Weight: 176 lb (79.8 kg)  Height: '5\' 6"'$  (1.676 m)    GENERAL: The patient is a well-nourished male, in no acute distress. The vital signs are documented above. CARDIAC: There is a regular rate and rhythm.  VASCULAR:  Palpable femoral pulses bilaterally Palpable DP and PT pulses bilaterally No lower extremity tissue loss PULMONARY: No respiratory distress. ABDOMEN: Soft and non-tender. MUSCULOSKELETAL: There are no major deformities or cyanosis. NEUROLOGIC: No focal weakness or paresthesias are detected. PSYCHIATRIC: The patient has a normal affect.  DATA:   ABI's are 1.11 on the right triphasic and 1.16 on the left triphasic  Assessment/Plan:  22 year old male  presents for evaluation of swelling and pain in the right second through fifth as well as the left second toes.  This has been an ongoing chronic problem for him over the last year.  I am unclear about the etiology and discussed he has normal ABIs with normal palpable pedal pulses on exam.  In fact his great toe tracings bilaterally are completely normal with adequate toe pressures.  I am unclear about the certain blunted waveforms on some of the digit tracings as this does not completely correlate with the toes that he states bother him.  I agree with the rheumatology workup.  Discussed there are common risk factors like Raynaud's in young people with vasospasm but his symptoms really do not sound like Raynaud's.  He also has toe swelling in certain digits that is not consistent with venous insufficiency.  Happy to see him in the future if anything changes.  Again I think he has more than adequate lower extremity arterial perfusion.     Marty Heck, MD [facility] Office: [phone]

## 2022-02-17 ENCOUNTER — Ambulatory Visit: Payer: Medicaid Other | Admitting: Family Medicine

## 2022-03-27 NOTE — Progress Notes (Signed)
Office Visit Note  Patient: Frank Leon.             Date of Birth: 06/22/2000           MRN: [id number]             PCP: Rise Patience, DO Referring: Dene Gentry, MD Visit Date: 03/28/2022 Occupation: Concrete work  Subjective:  [place] (Knee pain and rash on backside)   History of Present Illness: [place] Stellmacher. is a 22 y.o. male here for evaluation of pain and swelling in the right foot ongoing for almost 1 year.  He does not recall any preceding injury or events before the start of this swelling.  Initially there was pain then visible swelling and now has some discoloration on the top of the foot and at least 3 or 4 toes are affected.  Was treated with a prednisone taper without a very significant difference in symptoms.  He saw Dr. Barbaraann Barthel for evaluation with mostly unremarkable laboratory workup besides very slight sed rate elevation of 19.  MRI of the right foot obtained in November demonstrated joint effusions and synovitis in the second through fourth MTP joint and second and third IP joint in the right foot.  He has been taking the meloxicam 15 mg daily at this significantly improved his pain and stiffness but he does not see a change in the amount of swelling.  More recently he started having some swelling in the second toe of the left foot now he is having some left knee pain and feeling of pressure or difficulty fully flexing the knee.  This has been going on for the past month or 2.  The left knee pain is also improved with the meloxicam he does not see visible swelling. He has no previous history of any major joint injury or joint surgery.  He does fairly demanding work Soil scientist. He has noticed new skin rash in the gluteal cleft and inguinal fold areas ongoing for most of the past year as well.  This started after his foot swelling but ongoing for several months at least.  He does not feel any pain or itching just notices the area  due to the location and visible discoloration.  He has not tried any specific treatment for this.  No skin rashes elsewhere on the body. Denies any diarrhea or blood in stools.  He has not noticed any eye redness, irritation, or vision changes.  Labs reviewed 11/2021 ANA neg RF neg ESR 19 CRP 3  12/18/21 MRI Right foot IMPRESSION: 1. Second through fourth MTP joint and second and third IP joint effusions with mild synovitis. Findings are nonspecific, but can be seen in the setting of inflammatory arthropathy. 2. No acute osseous abnormality.  Activities of Daily Living:  Patient reports morning stiffness for 15 minutes.   Patient Denies nocturnal pain.  Difficulty dressing/grooming: Denies Difficulty climbing stairs: Reports Difficulty getting out of chair: Reports Difficulty using hands for taps, buttons, cutlery, and/or writing: Denies  Review of Systems  Constitutional: Negative.  Negative for fatigue.  HENT: Negative.  Negative for mouth sores and mouth dryness.   Eyes: Negative.  Negative for dryness.  Respiratory: Negative.  Negative for shortness of breath.   Cardiovascular: Negative.  Negative for chest pain and palpitations.  Gastrointestinal: Negative.  Negative for blood in stool, constipation and diarrhea.  Endocrine: Negative.  Negative for increased urination.  Genitourinary: Negative.  Negative for involuntary urination.  Musculoskeletal:  Positive  for joint pain, joint pain, joint swelling and morning stiffness. Negative for gait problem, myalgias, muscle weakness, muscle tenderness and myalgias.  Skin:  Positive for rash. Negative for pallor, hair loss and sensitivity to sunlight.  Allergic/Immunologic: Negative.  Negative for susceptible to infections.  Neurological: Negative.  Negative for dizziness and headaches.  Hematological: Negative.  Negative for swollen glands.  Psychiatric/Behavioral: Negative.  Negative for depressed mood. The patient is not  nervous/anxious.     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 No pertinent past medical history.  Family History  Problem Relation Age of Onset   Hyperlipidemia Mother    History reviewed. No pertinent surgical history. Social History   Social History Narrative   ** Merged History Encounter **       Immunization History  Administered Date(s) Administered   DTaP 09/06/2000, 10/26/2000, 02/03/2001, 05/22/2002, 08/10/2004   HIB (PRP-OMP) 09/06/2000, 10/26/2000, 02/03/2001, 10/24/2001   HPV 9-valent 06/04/2014, 08/20/2014, 03/16/2016   Hepatitis A 09/22/2005, 11/13/2007   Hepatitis B 03-29-00, 09/06/2000, 02/08/2001   IPV 09/06/2000, 10/26/2000, 10/24/2001, 08/10/2004   Influenza,inj,Quad PF,6+ Mos 06/04/2014, 03/16/2016   Influenza-Unspecified 04/01/2005, 11/13/2007, 01/14/2009   MMR 10/24/2001, 08/10/2004   Meningococcal Conjugate 06/04/2014   PFIZER(Purple Top)SARS-COV-2 Vaccination 05/22/2019, 06/16/2019   Pneumococcal Conjugate-13 09/06/2000, 10/26/2000, 10/24/2001   Td 09/25/2011   Tdap 09/25/2011   Varicella 10/24/2001, 09/22/2005     Objective: Vital Signs: BP 114/73 (BP Location: Right Arm, Patient Position: Sitting, Cuff Size: Normal)   Pulse 63   Resp 16   Ht 5' 7"$  (1.702 m)   Wt 170 lb 6.4 oz (77.3 kg)   BMI 26.69 kg/m    Physical Exam Eyes:     Conjunctiva/sclera: Conjunctivae normal.  Cardiovascular:     Rate and Rhythm: Normal rate and regular rhythm.  Pulmonary:     Effort: Pulmonary effort is normal.     Breath sounds: Normal breath sounds.  Musculoskeletal:     Right lower leg: No edema.     Left lower leg: No edema.  Lymphadenopathy:     Cervical: No cervical adenopathy.  Skin:    General: Skin is warm and dry.     Findings: Rash present.  Neurological:     Mental  Status: He is alert.  Psychiatric:        Mood and Affect: Mood normal.      Musculoskeletal Exam:  Shoulders full ROM no tenderness or swelling Elbows full ROM no tenderness or swelling Wrists full ROM no tenderness or swelling Fingers full ROM no tenderness or swelling Knees full ROM left knee trace effusion present without tenderness to pressure Ankles full ROM no tenderness or swelling Right foot swelling around 1st-4th MTPs diffuse swelling through 2nd and 3rd toes slightly decreased flexion ROM not tender to pressure and no warmth   Investigation: No additional findings.  Imaging: No results found.  Recent Labs: Lab Results  Component Value Date   WBC 5.6 03/28/2022   HGB 14.2 03/28/2022   PLT 368 03/28/2022   NA 137 03/28/2022   K 4.3 03/28/2022   CL 99 03/28/2022   CO2 28 03/28/2022   GLUCOSE 92 03/28/2022   BUN 24 03/28/2022   CREATININE 0.50 (L) 03/28/2022   BILITOT 0.5 03/28/2022   ALKPHOS 110 11/24/2021   AST 19 03/28/2022  ALT 25 03/28/2022   PROT 7.7 03/28/2022   ALBUMIN 4.7 11/24/2021   CALCIUM 9.6 03/28/2022    Speciality Comments: No specialty comments available.  Procedures:  No procedures performed Allergies: Patient has no known allergies.   Assessment / Plan:     Visit Diagnoses: Psoriatic arthritis ([place])  Foot swelling - Plan: Sedimentation rate, meloxicam (MOBIC) 15 MG tablet  Foot swelling appears consistent with dactylitis and overall presentation is concerning for psoriatic arthritis.  No evidence of deforming disease changes.  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  Peripheral joint synovitis so we will plan to try methotrexate as first DMARD option.  Can continue meloxicam 15 mg daily in the meanwhile for joint inflammation.  High risk medication use - Plan: CBC with Differential/Platelet, COMPLETE METABOLIC  PANEL WITH GFR, Hepatitis B core antibody, IgM, Hepatitis B surface antigen, Hepatitis C antibody  Will check baseline labs screening for starting disease modifying treatment option such as methotrexate.  No known history of abnormal kidney or liver function or chronic infections such as hepatitis HIV or tuberculosis.  Rash and other nonspecific skin eruption - Plan: triamcinolone cream (KENALOG) 0.1 %  Will getting baseline workup to discuss longer-term treatment option we will try starting a medium potency topical steroid Kenalog 0.1% twice daily on affected rash areas.  Orders: Orders Placed This Encounter  Procedures   Sedimentation rate   CBC with Differential/Platelet   COMPLETE METABOLIC PANEL WITH GFR   Hepatitis B core antibody, IgM   Hepatitis B surface antigen   Hepatitis C antibody   Meds ordered this encounter  Medications   triamcinolone cream (KENALOG) 0.1 %    Sig: Apply 1 Application topically 2 (two) times daily as needed. Leave at least 15 minutes after application. For up to 1 month.    Dispense:  30 g    Refill:  0   meloxicam (MOBIC) 15 MG tablet    Sig: Take 1 tablet (15 mg total) by mouth daily.    Dispense:  30 tablet    Refill:  0    Follow-Up Instructions: Return in about 2 weeks (around 04/11/2022) for New pt ?PsA f/u 2wks.   Collier Salina, MD  Note - This record has been created using [employer].  Chart creation errors have been sought, but may not always  have been located. Such creation errors do not reflect on  the standard of medical care.

## 2022-03-28 ENCOUNTER — Ambulatory Visit: Payer: Self-pay | Attending: Rheumatology | Admitting: Internal Medicine

## 2022-03-28 ENCOUNTER — Encounter: Payer: Self-pay | Admitting: Internal Medicine

## 2022-03-28 ENCOUNTER — Ambulatory Visit: Payer: Medicaid Other

## 2022-03-28 VITALS — BP 114/73 | HR 63 | Resp 16 | Ht 67.0 in | Wt 170.4 lb

## 2022-03-28 DIAGNOSIS — R21 Rash and other nonspecific skin eruption: Secondary | ICD-10-CM

## 2022-03-28 DIAGNOSIS — Z79899 Other long term (current) drug therapy: Secondary | ICD-10-CM

## 2022-03-28 DIAGNOSIS — L4050 Arthropathic psoriasis, unspecified: Secondary | ICD-10-CM

## 2022-03-28 DIAGNOSIS — M7989 Other specified soft tissue disorders: Secondary | ICD-10-CM

## 2022-03-28 LAB — CBC WITH DIFFERENTIAL/PLATELET
Absolute Monocytes: 454 cells/uL (ref 200–950)
Basophils Absolute: 28 cells/uL (ref 0–200)
Eosinophils Absolute: 90 cells/uL (ref 15–500)
Hemoglobin: 14.2 g/dL (ref 13.2–17.1)
Monocytes Relative: 8.1 %
Neutro Abs: 3310 cells/uL (ref 1500–7800)
RBC: 4.62 10*6/uL (ref 4.20–5.80)
Total Lymphocyte: 30.7 %
WBC: 5.6 10*3/uL (ref 3.8–10.8)

## 2022-03-28 MED ORDER — TRIAMCINOLONE ACETONIDE 0.1 % EX CREA: 1.0000 | TOPICAL_CREAM | Freq: Two times a day (BID) | CUTANEOUS | 0 refills | Status: DC | PRN

## 2022-03-28 MED ORDER — MELOXICAM 15 MG PO TABS: 15.0000 mg | ORAL_TABLET | Freq: Every day | ORAL | 0 refills | Status: DC

## 2022-03-29 LAB — COMPLETE METABOLIC PANEL WITH GFR
AG Ratio: 1.7 (calc) (ref 1.0–2.5)
ALT: 25 U/L (ref 9–46)
AST: 19 U/L (ref 10–40)
Albumin: 4.8 g/dL (ref 3.6–5.1)
Alkaline phosphatase (APISO): 115 U/L (ref 36–130)
BUN/Creatinine Ratio: 48 (calc) — ABNORMAL HIGH (ref 6–22)
BUN: 24 mg/dL (ref 7–25)
CO2: 28 mmol/L (ref 20–32)
Calcium: 9.6 mg/dL (ref 8.6–10.3)
Chloride: 99 mmol/L (ref 98–110)
Creat: 0.5 mg/dL — ABNORMAL LOW (ref 0.60–1.24)
Globulin: 2.9 g/dL (calc) (ref 1.9–3.7)
Glucose, Bld: 92 mg/dL (ref 65–99)
Potassium: 4.3 mmol/L (ref 3.5–5.3)
Sodium: 137 mmol/L (ref 135–146)
Total Bilirubin: 0.5 mg/dL (ref 0.2–1.2)
Total Protein: 7.7 g/dL (ref 6.1–8.1)
eGFR: 149 mL/min/{1.73_m2} (ref >=60)

## 2022-03-29 LAB — CBC WITH DIFFERENTIAL/PLATELET
Basophils Relative: 0.5 %
Eosinophils Relative: 1.6 %
HCT: 41.2 % (ref 38.5–50.0)
Lymphs Abs: 1719 cells/uL (ref 850–3900)
MCH: 30.7 pg (ref 27.0–33.0)
MCHC: 34.5 g/dL (ref 32.0–36.0)
MCV: 89.2 fL (ref 80.0–100.0)
MPV: 10.9 fL (ref 7.5–12.5)
Neutrophils Relative %: 59.1 %
Platelets: 368 10*3/uL (ref 140–400)
RDW: 12.2 % (ref 11.0–15.0)

## 2022-03-29 LAB — HEPATITIS C ANTIBODY: Hepatitis C Ab: NONREACTIVE

## 2022-03-29 LAB — HEPATITIS B SURFACE ANTIGEN: Hepatitis B Surface Ag: NONREACTIVE

## 2022-03-29 LAB — HEPATITIS B CORE ANTIBODY, IGM: Hep B C IgM: NONREACTIVE

## 2022-03-29 LAB — SEDIMENTATION RATE: Sed Rate: 28 mm/h — ABNORMAL HIGH (ref 0–15)

## 2022-03-29 NOTE — Progress Notes (Signed)
Kidney and liver function tests look fine and screening for hepatitis is negative as expected. Sedimentation rate test is increased at 28 compared to 19 from a few months ago.

## 2022-04-18 NOTE — Progress Notes (Signed)
Office Visit Note  Patient: Frank Leon.             Date of Birth: September 22, 2000           MRN: [id number]             PCP: Rise Patience, DO Referring: Rise Patience, DO Visit Date: 04/19/2022   Subjective:  Follow-up   History of Present Illness: Martina Gibby. is a 22 y.o. male here for follow up for what appears to be psoriatic arthritis.  Still with ongoing pain and joint swelling affecting toes and his right foot.  Application of the Kenalog on affected area has slightly decreased the scaling and itchiness but the area remains irritated and red coloration has not improved.  The meloxicam is partially helpful for his arthritis.  Previous HPI 03/28/22 Lewis Shock. is a 22 y.o. male here for evaluation of pain and swelling in the right foot ongoing for almost 1 year.  He does not recall any preceding injury or events before the start of this swelling.  Initially there was pain then visible swelling and now has some discoloration on the top of the foot and at least 3 or 4 toes are affected.  Was treated with a prednisone taper without a very significant difference in symptoms.  He saw Dr. Barbaraann Barthel for evaluation with mostly unremarkable laboratory workup besides very slight sed rate elevation of 19.  MRI of the right foot obtained in November demonstrated joint effusions and synovitis in the second through fourth MTP joint and second and third IP joint in the right foot.  He has been taking the meloxicam 15 mg daily at this significantly improved his pain and stiffness but he does not see a change in the amount of swelling.  More recently he started having some swelling in the second toe of the left foot now he is having some left knee pain and feeling of pressure or difficulty fully flexing the knee.  This has been going on for the past month or 2.  The left knee pain is also improved with the meloxicam he does not see visible swelling. He has no  previous history of any major joint injury or joint surgery.  He does fairly demanding work Soil scientist. He has noticed new skin rash in the gluteal cleft and inguinal fold areas ongoing for most of the past year as well.  This started after his foot swelling but ongoing for several months at least.  He does not feel any pain or itching just notices the area due to the location and visible discoloration.  He has not tried any specific treatment for this.  No skin rashes elsewhere on the body. Denies any diarrhea or blood in stools.  He has not noticed any eye redness, irritation, or vision changes.   Labs reviewed 11/2021 ANA neg RF neg ESR 19 CRP 3   12/18/21 MRI Right foot IMPRESSION: 1. Second through fourth MTP joint and second and third IP joint effusions with mild synovitis. Findings are nonspecific, but can be seen in the setting of inflammatory arthropathy. 2. No acute osseous abnormality.   Review of Systems  Constitutional: Negative.  Negative for fatigue.  HENT: Negative.  Negative for mouth sores and mouth dryness.   Eyes: Negative.  Negative for dryness.  Respiratory: Negative.  Negative for shortness of breath.   Cardiovascular: Negative.  Negative for chest pain and palpitations.  Gastrointestinal: Negative.  Negative for blood in stool, constipation  and diarrhea.  Endocrine: Negative.  Negative for increased urination.  Genitourinary: Negative.  Negative for involuntary urination.  Musculoskeletal:  Positive for joint pain, gait problem, joint pain, joint swelling, muscle weakness, morning stiffness and muscle tenderness. Negative for myalgias and myalgias.  Skin:  Positive for rash. Negative for color change, hair loss and sensitivity to sunlight.  Allergic/Immunologic: Negative.  Negative for susceptible to infections.  Neurological:  Negative for dizziness and headaches.  Hematological: Negative.  Negative for swollen glands.  Psychiatric/Behavioral: Negative.   Negative for depressed mood and sleep disturbance. The patient is not nervous/anxious.     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 No pertinent past medical history.  Family History  Problem Relation Age of Onset   Hyperlipidemia Mother    History reviewed. No pertinent surgical history. Social History   Social History Narrative   ** Merged History Encounter **       Immunization History  Administered Date(s) Administered   DTaP 09/06/2000, 10/26/2000, 02/03/2001, 05/22/2002, 08/10/2004   HIB (PRP-OMP) 09/06/2000, 10/26/2000, 02/03/2001, 10/24/2001   HPV 9-valent 06/04/2014, 08/20/2014, 03/16/2016   Hepatitis A 09/22/2005, 11/13/2007   Hepatitis B 2000/02/29, 09/06/2000, 02/08/2001   IPV 09/06/2000, 10/26/2000, 10/24/2001, 08/10/2004   Influenza,inj,Quad PF,6+ Mos 06/04/2014, 03/16/2016   Influenza-Unspecified 04/01/2005, 11/13/2007, 01/14/2009   MMR 10/24/2001, 08/10/2004   Meningococcal Conjugate 06/04/2014   PFIZER(Purple Top)SARS-COV-2 Vaccination 05/22/2019, 06/16/2019   Pneumococcal Conjugate-13 09/06/2000, 10/26/2000, 10/24/2001   Td 09/25/2011   Tdap 09/25/2011   Varicella 10/24/2001, 09/22/2005     Objective: Vital Signs: BP 99/64 (BP Location: Right Arm, Patient Position: Sitting, Cuff Size: Large)   Pulse 62   Resp 16   Ht 5\' 7"  (1.702 m)   Wt 173 lb 3.2 oz (78.6 kg)   BMI 27.13 kg/m    Physical Exam Cardiovascular:     Rate and Rhythm: Normal rate and regular rhythm.  Pulmonary:     Effort: Pulmonary effort is normal.     Breath sounds: Normal breath sounds.  Lymphadenopathy:     Cervical: No cervical adenopathy.  Skin:    General: Skin is warm and dry.  Neurological:     Mental Status: He is alert.  Psychiatric:        Mood  and Affect: Mood normal.      Musculoskeletal Exam:  Shoulders full ROM no tenderness or swelling Elbows full ROM no tenderness or swelling Wrists full ROM no tenderness or swelling Fingers full ROM no tenderness or swelling Knees full ROM no tenderness or swelling Ankles full ROM no tenderness or swelling Right foot MTP tenderness and 2nd-4th digits diffuse swelling with hyperpigmentation and mild erythema, also on dorsal side of foot proximal to 1st MTP   Investigation: No additional findings.  Imaging: No results found.  Recent Labs: Lab Results  Component Value Date   WBC 5.6 03/28/2022   HGB 14.2 03/28/2022   PLT 368 03/28/2022   NA 137 03/28/2022   K 4.3 03/28/2022   CL 99 03/28/2022   CO2 28 03/28/2022   GLUCOSE 92 03/28/2022   BUN 24 03/28/2022   CREATININE 0.50 (L) 03/28/2022   BILITOT 0.5 03/28/2022   ALKPHOS 110 11/24/2021   AST 19 03/28/2022   ALT 25 03/28/2022   PROT 7.7 03/28/2022  ALBUMIN 4.7 11/24/2021   CALCIUM 9.6 03/28/2022    Speciality Comments: No specialty comments available.  Procedures:  No procedures performed Allergies: Patient has no known allergies.   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  Currently the foot and toe inflammation looks most suggestive for tenosynovitis or enthesitis type of inflammation.  No peripheral involvement in the arms but also no other axial involvement.  Plan to start methotrexate 15 mg p.o. weekly and folic acid 1 mg daily.  Discussed this would be expected to improve both skin and joint disease.  Can continue meloxicam 15 mg daily as needed for joint pain in the meanwhile.  High risk medication use  Discussed risks of methotrexate treatment including infections, cytopenias,  and hepatotoxicity.  Discussed risk of GI irritation or malaise while taking the medicine.  He agrees to avoiding alcohol consumption while on the medication.  Orders: No orders of the defined types were placed in this encounter.  Meds ordered this encounter  Medications   methotrexate (RHEUMATREX) 2.5 MG tablet    Sig: Take 6 tablets (15 mg total) by mouth once a week. Caution:Chemotherapy. Protect from light.    Dispense:  30 tablet    Refill:  1   folic acid (FOLVITE) 1 MG tablet    Sig: Take 1 tablet (1 mg total) by mouth daily.    Dispense:  90 tablet    Refill:  0   meloxicam (MOBIC) 15 MG tablet    Sig: Take 1 tablet (15 mg total) by mouth daily as needed for pain.    Dispense:  30 tablet    Refill:  0     Follow-Up Instructions: Return in about 6 weeks (around 05/31/2022) for PsA MTX start f/u 6wks.   Collier Salina, MD  Note - This record has been created using [employer].  Chart creation errors have been sought, but may not always  have been located. Such creation errors do not reflect on  the standard of medical care.

## 2022-04-19 ENCOUNTER — Ambulatory Visit: Payer: Self-pay | Attending: Internal Medicine | Admitting: Internal Medicine

## 2022-04-19 ENCOUNTER — Encounter: Payer: Self-pay | Admitting: Internal Medicine

## 2022-04-19 VITALS — BP 99/64 | HR 62 | Resp 16 | Ht 67.0 in | Wt 173.2 lb

## 2022-04-19 DIAGNOSIS — L4050 Arthropathic psoriasis, unspecified: Secondary | ICD-10-CM

## 2022-04-19 DIAGNOSIS — Z79899 Other long term (current) drug therapy: Secondary | ICD-10-CM | POA: Insufficient documentation

## 2022-04-19 MED ORDER — METHOTREXATE SODIUM 2.5 MG PO TABS: 15.0000 mg | ORAL_TABLET | ORAL | 1 refills | Status: DC

## 2022-04-19 MED ORDER — MELOXICAM 15 MG PO TABS: 15.0000 mg | ORAL_TABLET | Freq: Every day | ORAL | 0 refills | Status: DC | PRN

## 2022-04-19 MED ORDER — FOLIC ACID 1 MG PO TABS: 1.0000 mg | ORAL_TABLET | Freq: Every day | ORAL | 0 refills | Status: DC

## 2022-05-30 ENCOUNTER — Ambulatory Visit: Payer: Medicaid Other | Attending: Internal Medicine | Admitting: Internal Medicine

## 2022-05-30 ENCOUNTER — Encounter: Payer: Self-pay | Admitting: Internal Medicine

## 2022-05-30 VITALS — BP 116/77 | HR 71 | Ht 67.0 in | Wt 176.0 lb

## 2022-05-30 DIAGNOSIS — L4050 Arthropathic psoriasis, unspecified: Secondary | ICD-10-CM

## 2022-05-30 DIAGNOSIS — Z79899 Other long term (current) drug therapy: Secondary | ICD-10-CM

## 2022-05-30 LAB — CBC WITH DIFFERENTIAL/PLATELET
Absolute Monocytes: 511 cells/uL (ref 200–950)
Eosinophils Absolute: 238 cells/uL (ref 15–500)
Eosinophils Relative: 3.4 %
HCT: 37.8 % — ABNORMAL LOW (ref 38.5–50.0)
RDW: 12.9 % (ref 11.0–15.0)

## 2022-05-30 NOTE — Progress Notes (Unsigned)
Office Visit Note  Patient: Frank Leon.             Date of Birth: 2000-02-20           MRN: [id number]             PCP: Evelena Leyden, DO Referring: Evelena Leyden, DO Visit Date: 05/30/2022   Subjective:   History of Present Illness: Jerryl Holzhauer. is a 22 y.o. male here for follow up for psoriatic arthritis after starting methotrexate 15mg  PO weekly and folic acid 1 mg daily and meloxicam 15 mg as needed. So far rash is less erythematous but still remains. Toe pain and swelling persists, noticing ingrown toenails in multiple digits due to the swelling. He feels fatigued for 1 day after taking each methotrexate dose but otherwise tolerating okay.  Previous HPI 04/19/22 [name]. is a 22 y.o. male here for follow up for what appears to be psoriatic arthritis.  Still with ongoing pain and joint swelling affecting toes and his right foot.  Application of the Kenalog on affected area has slightly decreased the scaling and itchiness but the area remains irritated and red coloration has not improved.  The meloxicam is partially helpful for his arthritis.   Previous HPI 03/28/22 [name]. is a 22 y.o. male here for evaluation of pain and swelling in the right foot ongoing for almost 1 year.  He does not recall any preceding injury or events before the start of this swelling.  Initially there was pain then visible swelling and now has some discoloration on the top of the foot and at least 3 or 4 toes are affected.  Was treated with a prednisone taper without a very significant difference in symptoms.  He saw Dr. Pearletha Forge for evaluation with mostly unremarkable laboratory workup besides very slight sed rate elevation of 19.  MRI of the right foot obtained in November demonstrated joint effusions and synovitis in the second through fourth MTP joint and second and third IP joint in the right foot.  He has been taking the meloxicam 15  mg daily at this significantly improved his pain and stiffness but he does not see a change in the amount of swelling.  More recently he started having some swelling in the second toe of the left foot now he is having some left knee pain and feeling of pressure or difficulty fully flexing the knee.  This has been going on for the past month or 2.  The left knee pain is also improved with the meloxicam he does not see visible swelling. He has no previous history of any major joint injury or joint surgery.  He does fairly demanding work Engineer, structural. He has noticed new skin rash in the gluteal cleft and inguinal fold areas ongoing for most of the past year as well.  This started after his foot swelling but ongoing for several months at least.  He does not feel any pain or itching just notices the area due to the location and visible discoloration.  He has not tried any specific treatment for this.  No skin rashes elsewhere on the body. Denies any diarrhea or blood in stools.  He has not noticed any eye redness, irritation, or vision changes.   Labs reviewed 11/2021 ANA neg RF neg ESR 19 CRP 3   12/18/21 MRI Right foot IMPRESSION: 1. Second through fourth MTP joint and second and third IP joint effusions with mild synovitis. Findings are nonspecific, but  can be seen in the setting of inflammatory arthropathy. 2. No acute osseous abnormality.     No Rheumatology ROS completed.   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  Family History  Problem Relation Age of Onset   Hyperlipidemia Mother    No past surgical history on file. Social History   Social History Narrative   ** Merged History Encounter **       Immunization History  Administered Date(s)  Administered   DTaP 09/06/2000, 10/26/2000, 02/03/2001, 05/22/2002, 08/10/2004   HIB (PRP-OMP) 09/06/2000, 10/26/2000, 02/03/2001, 10/24/2001   HPV 9-valent 06/04/2014, 08/20/2014, 03/16/2016   Hepatitis A 09/22/2005, 11/13/2007   Hepatitis B 2000-10-04, 09/06/2000, 02/08/2001   IPV 09/06/2000, 10/26/2000, 10/24/2001, 08/10/2004   Influenza,inj,Quad PF,6+ Mos 06/04/2014, 03/16/2016   Influenza-Unspecified 04/01/2005, 11/13/2007, 01/14/2009   MMR 10/24/2001, 08/10/2004   Meningococcal Conjugate 06/04/2014   PFIZER(Purple Top)SARS-COV-2 Vaccination 05/22/2019, 06/16/2019   Pneumococcal Conjugate-13 09/06/2000, 10/26/2000, 10/24/2001   Td 09/25/2011   Tdap 09/25/2011   Varicella 10/24/2001, 09/22/2005     Objective: Vital Signs: Ht  (1.702 m)   Wt 176 lb (79.8 kg)   BMI 27.57 kg/m    Physical Exam Cardiovascular:     Rate and Rhythm: Normal rate and regular rhythm.  Pulmonary:     Effort: Pulmonary effort is normal.     Breath sounds: Normal breath sounds.  Skin:    General: Skin is warm and dry.  Neurological:     Mental Status: He is alert.  Psychiatric:        Mood and Affect: Mood normal.      Musculoskeletal Exam:  Shoulders full ROM no tenderness or swelling Elbows full ROM no tenderness or swelling Wrists full ROM no tenderness or swelling Fingers full ROM no tenderness or swelling Knees full ROM no tenderness or swelling Swelling in multiple toes fairly mild but longitudinally throughout whole digit, with ingrown toenails at second digit.   Investigation: No additional findings.  Imaging: No results found.  Recent Labs: Lab Results  Component Value Date   WBC 5.6 03/28/2022   HGB 14.2 03/28/2022   PLT 368 03/28/2022   NA 137 03/28/2022   K 4.3 03/28/2022   CL 99 03/28/2022   CO2 28 03/28/2022   GLUCOSE 92 03/28/2022   BUN 24 03/28/2022   CREATININE 0.50 (L) 03/28/2022   BILITOT 0.5 03/28/2022   ALKPHOS 110 11/24/2021   AST 19 03/28/2022    ALT 25 03/28/2022   PROT 7.7 03/28/2022   ALBUMIN 4.7 11/24/2021   CALCIUM 9.6 03/28/2022    Speciality Comments: No specialty comments available.  Procedures:  No procedures performed Allergies: Patient has no known allergies.   Assessment / Plan:     Visit Diagnoses: Psoriatic arthritis - Plan: Sedimentation rate  So far has not seen large improvement in joint inflammation skin rash getting slightly less inflammatory but also still present.  Could be too soon after starting methotrexate versus possible lack of treatment efficacy.  Will recheck the previously elevated sedimentation rate if this is downtrending would be encouraging but as long as labs okay would recommend dose titration up to 20 mg and with split dose.  If there is a problem with switch to sulfasalazine.  High risk medication  use - Plan: CBC with Differential/Platelet, COMPLETE METABOLIC PANEL WITH GFR  Checking CBC and CMP for medication monitoring after methotrexate start.  Is having 1 day of fatigue no other significant medication tolerance.  Discussed and provided contact information about setting up with [place] financial assistance program to help with ongoing healthcare costs for clinic follow-up and medication monitoring labs.  Orders: Orders Placed This Encounter  Procedures   CBC with Differential/Platelet   COMPLETE METABOLIC PANEL WITH GFR   Sedimentation rate   No orders of the defined types were placed in this encounter.    Follow-Up Instructions: Return in about 2 months (around 07/30/2022) for PsA on MTX f/u 2mos.   Fuller Plan, MD  Note - This record has been created using [employer].  Chart creation errors have been sought, but may not always  have been located. Such creation errors do not reflect on  the standard of medical care.

## 2022-05-31 LAB — CBC WITH DIFFERENTIAL/PLATELET
Basophils Absolute: 28 cells/uL (ref 0–200)
Basophils Relative: 0.4 %
Hemoglobin: 12.6 g/dL — ABNORMAL LOW (ref 13.2–17.1)
Lymphs Abs: 1981 cells/uL (ref 850–3900)
MCH: 29.2 pg (ref 27.0–33.0)
MCHC: 33.3 g/dL (ref 32.0–36.0)
MCV: 87.5 fL (ref 80.0–100.0)
MPV: 10.9 fL (ref 7.5–12.5)
Monocytes Relative: 7.3 %
Neutro Abs: 4242 cells/uL (ref 1500–7800)
Neutrophils Relative %: 60.6 %
Platelets: 330 10*3/uL (ref 140–400)
RBC: 4.32 10*6/uL (ref 4.20–5.80)
Total Lymphocyte: 28.3 %
WBC: 7 10*3/uL (ref 3.8–10.8)

## 2022-05-31 LAB — COMPLETE METABOLIC PANEL WITH GFR
AG Ratio: 1.8 (calc) (ref 1.0–2.5)
ALT: 37 U/L (ref 9–46)
AST: 22 U/L (ref 10–40)
Albumin: 4.4 g/dL (ref 3.6–5.1)
Alkaline phosphatase (APISO): 98 U/L (ref 36–130)
BUN: 17 mg/dL (ref 7–25)
CO2: 26 mmol/L (ref 20–32)
Calcium: 8.9 mg/dL (ref 8.6–10.3)
Chloride: 104 mmol/L (ref 98–110)
Creat: 0.66 mg/dL (ref 0.60–1.24)
Globulin: 2.5 g/dL (calc) (ref 1.9–3.7)
Glucose, Bld: 107 mg/dL (ref 65–139)
Potassium: 4 mmol/L (ref 3.5–5.3)
Sodium: 138 mmol/L (ref 135–146)
Total Bilirubin: 0.3 mg/dL (ref 0.2–1.2)
Total Protein: 6.9 g/dL (ref 6.1–8.1)
eGFR: 137 mL/min/{1.73_m2} (ref >=60)

## 2022-05-31 LAB — SEDIMENTATION RATE: Sed Rate: 17 mm/h — ABNORMAL HIGH (ref 0–15)

## 2022-06-02 ENCOUNTER — Ambulatory Visit: Payer: Medicaid Other | Admitting: Internal Medicine

## 2022-06-05 ENCOUNTER — Other Ambulatory Visit: Payer: Self-pay | Admitting: *Deleted

## 2022-06-05 DIAGNOSIS — L4050 Arthropathic psoriasis, unspecified: Secondary | ICD-10-CM

## 2022-06-05 MED ORDER — METHOTREXATE SODIUM 2.5 MG PO TABS: 20.0000 mg | ORAL_TABLET | ORAL | 2 refills | Status: DC

## 2022-06-05 NOTE — Progress Notes (Signed)
Sedimentation rate is partially improved at 17. Hemoglobin is slightly decreased at 12.6. Metabolic panel is normal so no problem from the medication. I recommend he try increasing the methotrexate to 8 tablets per week as discussed.

## 2022-06-23 ENCOUNTER — Ambulatory Visit (INDEPENDENT_AMBULATORY_CARE_PROVIDER_SITE_OTHER): Payer: Self-pay | Admitting: Student

## 2022-06-23 VITALS — BP 120/70 | HR 57 | Ht 66.0 in | Wt 175.0 lb

## 2022-06-23 DIAGNOSIS — Z79899 Other long term (current) drug therapy: Secondary | ICD-10-CM

## 2022-06-23 DIAGNOSIS — R0781 Pleurodynia: Secondary | ICD-10-CM | POA: Insufficient documentation

## 2022-06-23 MED ORDER — LIDOCAINE 4 % EX PTCH
1.0000 | MEDICATED_PATCH | CUTANEOUS | 0 refills | Status: DC
Start: 2022-06-23 — End: 2022-08-14

## 2022-06-23 MED ORDER — DICLOFENAC SODIUM 1 % EX GEL
4.0000 g | Freq: Four times a day (QID) | CUTANEOUS | 0 refills | Status: DC
Start: 2022-06-23 — End: 2023-02-01

## 2022-06-23 NOTE — Patient Instructions (Signed)
It was great to see you! Thank you for allowing me to participate in your care!   Our plans for today:  -I am going to prescribe a topical lidocaine patch that you can put on this area every 24 hours to help with pain, I am also can prescribe a topical Voltaren gel to put on top of this area as needed up to 4 times a day to help with inflammation -I am also can get a chest x-ray to rule out any kind of infectious cause at this -If you are coughing continues for the next week or 2 I would like for you to come back for pulmonary function testing  Take care and seek immediate care sooner if you develop any concerns.  Levin Erp, MD

## 2022-06-23 NOTE — Progress Notes (Signed)
    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, he does not remember if he strained it specifically No fevers No vomiting or diarrhea Some coughing at night time that is worse Some nasal congestion Started on methotrexate 15 mg weekly on 3/6 by rheumatology Saw Rheumatology on 4/16 and recommended dose titration up to 20 mg weekly Think his chest pain or discomfort has sided with the medication  PERTINENT  PMH / PSH: psoriatic arthritis  OBJECTIVE:   BP 120/70   Pulse (!) 57   Ht 5\' 6"  (1.676 m)   Wt 175 lb (79.4 kg)   SpO2 99%   BMI 28.25 kg/m   General: Well appearing, NAD, awake, alert, responsive to questions Head: Normocephalic atraumatic CV: Regular rate and rhythm no murmurs rubs or gallops Respiratory: Clear to ausculation bilaterally, no wheezes rales or crackles, chest rises symmetrically,  no increased work of breathing Chest: Right lower lateral rib space with tenderness to palpation between in costochondral space  Abdomen: Soft, non-tender, non-distended, normoactive bowel sounds, negative Murphy's Extremities: Moves upper and lower extremities freely  ASSESSMENT/PLAN:   Rib pain Right lower costochondral rib pain.  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  Would also like to rule out any kind of pulmonary process given patient is on methotrexate. Unlikely to be cardiac given location, pleuritic component and patient age. Advised patient if coughing continues to please reach back out to [place] so we could consider formal PFTs as there is an increased risk of fibrosis/lung injury from methotrexate use usually in the first year that it is started. - lidocaine (HM LIDOCAINE PATCH) 4 %;  Place 1 patch onto the skin daily.  Dispense: 5 patch; Refill: 0 - diclofenac Sodium (VOLTAREN ARTHRITIS PAIN) 1 % GEL; Apply 4 g topically 4 (four) times daily.  Dispense: 50 g; Refill: 0 - DG Chest 2 View; Future   Levin Erp, MD [facility]

## 2022-06-23 NOTE — Assessment & Plan Note (Addendum)
Right lower costochondral rib pain.  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  Would also like to rule out any kind of pulmonary process given patient is on methotrexate. Unlikely to be cardiac given location, pleuritic component and patient age. Advised patient if coughing continues to please reach back out to [place] so we could consider formal PFTs as there is an increased risk of fibrosis/lung injury from methotrexate use usually in the first year that it is started. - lidocaine (HM LIDOCAINE PATCH) 4 %; Place 1 patch onto the skin daily.  Dispense: 5 patch; Refill: 0 - diclofenac Sodium (VOLTAREN ARTHRITIS PAIN) 1 % GEL; Apply 4 g topically 4 (four) times daily.  Dispense: 50 g; Refill: 0 - DG Chest 2 View; Future

## 2022-06-26 ENCOUNTER — Other Ambulatory Visit: Payer: Self-pay | Admitting: Internal Medicine

## 2022-06-26 DIAGNOSIS — L4050 Arthropathic psoriasis, unspecified: Secondary | ICD-10-CM

## 2022-06-27 ENCOUNTER — Encounter: Payer: Medicaid Other | Admitting: Rheumatology

## 2022-07-10 ENCOUNTER — Other Ambulatory Visit: Payer: Self-pay | Admitting: Internal Medicine

## 2022-07-10 DIAGNOSIS — L4050 Arthropathic psoriasis, unspecified: Secondary | ICD-10-CM

## 2022-07-11 NOTE — Telephone Encounter (Signed)
Last Fill: 04/19/2022  Next Visit: 08/02/2022  Last Visit: 05/30/2022  Dx: Psoriatic arthritis (HCC)    Current Dose per office note on 05/30/2022: folic acid 1 mg daily   Okay to refill Folic Acid?

## 2022-07-25 ENCOUNTER — Ambulatory Visit: Payer: Medicaid Other | Admitting: Rheumatology

## 2022-07-25 ENCOUNTER — Encounter: Payer: Self-pay | Admitting: Student

## 2022-07-25 ENCOUNTER — Ambulatory Visit (INDEPENDENT_AMBULATORY_CARE_PROVIDER_SITE_OTHER): Payer: Self-pay | Admitting: Student

## 2022-07-25 VITALS — BP 123/71 | HR 65 | Ht 67.0 in | Wt 173.0 lb

## 2022-07-25 DIAGNOSIS — L600 Ingrowing nail: Secondary | ICD-10-CM

## 2022-07-25 MED ORDER — CEPHALEXIN 500 MG PO CAPS: 500.0000 mg | ORAL_CAPSULE | Freq: Two times a day (BID) | ORAL | 0 refills | Status: AC

## 2022-07-25 NOTE — Progress Notes (Unsigned)
  SUBJECTIVE:   CHIEF COMPLAINT / HPI:   In grown nail in bilateral toes Has been an issue for around 6 months. Has tried clipping the nails, and it helps for a little while, but always grows back. Notes drainge that is yellow/green/red sometimes. No fevers, otherwise feeling like normal state of health. 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    PERTINENT  PMH / PSH: ***  No past medical history on file.  Patient Care Team: Evelena Leyden, DO as PCP - General (Family Medicine) Ettefagh, Aron Baba, MD (Pediatrics) OBJECTIVE:  BP 123/71   Pulse 65   Ht 5\' 7"  (1.702 m)   Wt 173 lb (78.5 kg)   SpO2 100%   BMI 27.10 kg/m  Physical Exam   ASSESSMENT/PLAN:  There are no diagnoses linked to this encounter. No follow-ups on file. Bess Kinds, MD 07/25/2022, 3:03 PM PGY-***, [facility] {    This will disappear when note is signed, click to select method of visit    :1}

## 2022-07-25 NOTE — Patient Instructions (Addendum)
It was great to see you! Thank you for allowing me to participate in your care!  You have 2 ingrown toenails. We will treat with antibiotics for 5 days, and have you make an appointment in our [facility], for an "toenail avulsion"  Our plans for today:   Ingrown toenails  - Keflex 500 twice a day x 7 days - Epsom salt bath soaks, twice a day - Make an appointment in our Dermatology Clinic for "Toe nail avulsion"  Take care and seek immediate care sooner if you develop any concerns.   Dr. Bess Kinds, MD [facility]

## 2022-07-27 DIAGNOSIS — L600 Ingrowing nail: Secondary | ICD-10-CM | POA: Insufficient documentation

## 2022-07-27 NOTE — Assessment & Plan Note (Signed)
Patient presents with ingrown toenail bilaterally, on second toe from left on left foot, and third toe from right on right foot.  Patient notes things have been going on for a few months, and notes that it drains a pussy bloody liquid at the end of the days.  Notes he has tried to clip them down in the past but they continue to come on back.  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  Patient kneels concerning for infection, given drainage and slight erythematous crusting, and ingrown toenail.  Patient denies systemic symptoms and is otherwise in normal state of health. Will treat with course of antibiotics and have patient follow-up in Derm clinic for removal. - Keflex 500 mg twice daily x 7 days - Schedule appointment in Derm procedure clinic for toenail avulsion

## 2022-08-01 NOTE — Progress Notes (Signed)
Office Visit Note  Patient: Frank Leon.             Date of Birth: Jun 16, 2000           MRN: [id number]             PCP: Fortunato Curling, DO Referring: Evelena Leyden, DO Visit Date: 08/02/2022   Subjective:  Follow-up (Patient states he has been having ingrown toenails.)   History of Present Illness: Frank Leon. is a 22 y.o. male here for follow up for psoriatic arthritis on methotrexate increased to 20 mg p.o. weekly after last visit and folic acid 1 mg daily and meloxicam 15 mg daily as needed.  Reports joint pain is doing somewhat better but he still has persistent swelling affecting some toes on the right foot.  Also having trouble with ingrown toenails.  Skin rash remains in groin area no definite change with the medication increase.   Previous HPI 05/30/22 Frank Leon. is a 22 y.o. male here for follow up for psoriatic arthritis after starting methotrexate 15mg  PO weekly and folic acid 1 mg daily and meloxicam 15 mg as needed. So far rash is less erythematous but still remains. Toe pain and swelling persists, noticing ingrown toenails in multiple digits due to the swelling. He feels fatigued for 1 day after taking each methotrexate dose but otherwise tolerating okay.   Previous HPI 04/19/22 Frank Leon. is a 22 y.o. male here for follow up for what appears to be psoriatic arthritis.  Still with ongoing pain and joint swelling affecting toes and his right foot.  Application of the Kenalog on affected area has slightly decreased the scaling and itchiness but the area remains irritated and red coloration has not improved.  The meloxicam is partially helpful for his arthritis.   Previous HPI 03/28/22 Frank Leon. is a 22 y.o. male here for evaluation of pain and swelling in the right foot ongoing for almost 1 year.  He does not recall any preceding injury or events before the start of this swelling.   Initially there was pain then visible swelling and now has some discoloration on the top of the foot and at least 3 or 4 toes are affected.  Was treated with a prednisone taper without a very significant difference in symptoms.  He saw Dr. Pearletha Forge for evaluation with mostly unremarkable laboratory workup besides very slight sed rate elevation of 19.  MRI of the right foot obtained in November demonstrated joint effusions and synovitis in the second through fourth MTP joint and second and third IP joint in the right foot.  He has been taking the meloxicam 15 mg daily at this significantly improved his pain and stiffness but he does not see a change in the amount of swelling.  More recently he started having some swelling in the second toe of the left foot now he is having some left knee pain and feeling of pressure or difficulty fully flexing the knee.  This has been going on for the past month or 2.  The left knee pain is also improved with the meloxicam he does not see visible swelling. He has no previous history of any major joint injury or joint surgery.  He does fairly demanding work Engineer, structural. He has noticed new skin rash in the gluteal cleft and inguinal fold areas ongoing for most of the past year as well.  This started after his foot swelling but ongoing for several months at least.  He does not feel any pain or itching just notices the area due to the location and visible discoloration.  He has not tried any specific treatment for this.  No skin rashes elsewhere on the body. Denies any diarrhea or blood in stools.  He has not noticed any eye redness, irritation, or vision changes.   Labs reviewed 11/2021 ANA neg RF neg ESR 19 CRP 3   12/18/21 MRI Right foot IMPRESSION: 1. Second through fourth MTP joint and second and third IP joint effusions with mild synovitis. Findings are nonspecific, but can be seen in the setting of inflammatory arthropathy. 2. No acute osseous  abnormality.   Review of Systems  Constitutional:  Negative for fatigue.  HENT:  Negative for mouth sores and mouth dryness.   Eyes:  Negative for dryness.  Respiratory:  Positive for shortness of breath.   Cardiovascular:  Positive for chest pain and palpitations.  Gastrointestinal:  Negative for blood in stool, constipation and diarrhea.  Endocrine: Negative for increased urination.  Genitourinary:  Negative for involuntary urination.  Musculoskeletal:  Positive for joint pain, joint pain, joint swelling, muscle weakness, morning stiffness and muscle tenderness. Negative for gait problem, myalgias and myalgias.  Skin:  Positive for rash. Negative for color change, hair loss and sensitivity to sunlight.  Allergic/Immunologic: Negative for susceptible to infections.  Neurological:  Negative for dizziness and headaches.  Hematological:  Negative for swollen glands.  Psychiatric/Behavioral:  Negative for depressed mood and sleep disturbance. The patient is not nervous/anxious.     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 No pertinent past medical history.  Family History  Problem Relation Age of Onset   Hyperlipidemia Mother    History reviewed. No pertinent surgical history. Social History   Social History Narrative   ** Merged History Encounter **       Immunization History  Administered Date(s) Administered   DTaP 09/06/2000, 10/26/2000, 02/03/2001, 05/22/2002, 08/10/2004   HIB (PRP-OMP) 09/06/2000, 10/26/2000, 02/03/2001, 10/24/2001   HPV 9-valent 06/04/2014, 08/20/2014, 03/16/2016   Hepatitis A 09/22/2005, 11/13/2007   Hepatitis B 11-Dec-2000, 09/06/2000, 02/08/2001   IPV 09/06/2000,  10/26/2000, 10/24/2001, 08/10/2004   Influenza,inj,Quad PF,6+ Mos 06/04/2014, 03/16/2016   Influenza-Unspecified 04/01/2005, 11/13/2007, 01/14/2009   MMR 10/24/2001, 08/10/2004   Meningococcal Conjugate 06/04/2014   PFIZER(Purple Top)SARS-COV-2 Vaccination 05/22/2019, 06/16/2019   Pneumococcal Conjugate-13 09/06/2000, 10/26/2000, 10/24/2001   Td 09/25/2011   Tdap 09/25/2011   Varicella 10/24/2001, 09/22/2005     Objective: Vital Signs: BP 111/74 (BP Location: Left Arm, Patient Position: Sitting, Cuff Size: Normal)   Pulse 60   Resp 14   Ht 5\' 7"  (1.702 m)   Wt 171 lb (77.6 kg)   BMI 26.78 kg/m    Physical Exam Cardiovascular:     Rate and Rhythm: Normal rate and regular rhythm.  Pulmonary:     Effort: Pulmonary effort is normal.     Breath sounds: Normal breath sounds.  Neurological:     Mental Status: He is alert.  Psychiatric:        Mood and Affect: Mood normal.      Musculoskeletal Exam:  Shoulders full ROM no tenderness or swelling Elbows full ROM no tenderness or swelling  Wrists full ROM no tenderness or swelling Fingers full ROM no tenderness or swelling Knees full ROM no tenderness or swelling Ankles full ROM no tenderness or swelling Mild swelling throughout the full length of the toe second and third right digits   Investigation: No additional findings.  Imaging: No results found.  Recent Labs: Lab Results  Component Value Date   WBC 6.8 08/02/2022   HGB 13.3 08/02/2022   PLT 365 08/02/2022   NA 138 08/02/2022   K 4.3 08/02/2022   CL 102 08/02/2022   CO2 30 08/02/2022   GLUCOSE 88 08/02/2022   BUN 15 08/02/2022   CREATININE 0.61 08/02/2022   BILITOT 0.5 08/02/2022   ALKPHOS 110 11/24/2021   AST 25 08/02/2022   ALT 36 08/02/2022   PROT 7.5 08/02/2022   ALBUMIN 4.7 11/24/2021   CALCIUM 9.5 08/02/2022    Speciality Comments: No specialty comments available.  Procedures:  No procedures performed Allergies: Patient has no known  allergies.   Assessment / Plan:     Visit Diagnoses: Psoriatic arthritis (HCC) - Plan: Sedimentation rate, methotrexate (RHEUMATREX) 2.5 MG tablet  Overall symptoms look pretty mild but still with mild evidence of inflammation with rash and toe swelling.  No apparent difference after methotrexate increased.  Will recheck sedimentation rate see if there is a difference in systemic inflammation.  If this looks okay could continue on the methotrexate may be back down to 15 mg p.o. weekly folic acid 1 mg daily if there is no more benefit at a higher dose.  If sed rate is very high or symptoms worse consider switching to sulfasalazine.  High risk medication use - Plan: CBC with Differential/Platelet, COMPLETE METABOLIC PANEL WITH GFR  Checking CBC and CMP for medication monitoring on methotrexate with dose increased.  No significant side effects reported no serious interval infections.  Ingrown toenail of both feet  Discussed ingrown toenails not sure if these are directly related to the swelling in the affected digits.  No evidence of paronychia or serious appearing complication.  Will see podiatry for additional local management.  Orders: Orders Placed This Encounter  Procedures   Sedimentation rate   CBC with Differential/Platelet   COMPLETE METABOLIC PANEL WITH GFR   Meds ordered this encounter  Medications   methotrexate (RHEUMATREX) 2.5 MG tablet    Sig: Take 6 tablets (15 mg total) by mouth once a week. Caution:Chemotherapy. Protect from light.    Dispense:  72 tablet    Refill:  0     Follow-Up Instructions: Return in about 3 months (around 11/02/2022) for PsA on MTX f/u 3mos.   Fuller Plan, MD  Note - This record has been created using [employer].  Chart creation errors have been sought, but may not always  have been located. Such creation errors do not reflect on  the standard of medical care.

## 2022-08-02 ENCOUNTER — Encounter: Payer: Self-pay | Admitting: Internal Medicine

## 2022-08-02 ENCOUNTER — Ambulatory Visit: Payer: Self-pay | Attending: Internal Medicine | Admitting: Internal Medicine

## 2022-08-02 VITALS — BP 111/74 | HR 60 | Resp 14 | Ht 67.0 in | Wt 171.0 lb

## 2022-08-02 DIAGNOSIS — Z79899 Other long term (current) drug therapy: Secondary | ICD-10-CM | POA: Insufficient documentation

## 2022-08-02 DIAGNOSIS — L4050 Arthropathic psoriasis, unspecified: Secondary | ICD-10-CM | POA: Insufficient documentation

## 2022-08-02 DIAGNOSIS — L600 Ingrowing nail: Secondary | ICD-10-CM | POA: Insufficient documentation

## 2022-08-02 LAB — CBC WITH DIFFERENTIAL/PLATELET
Absolute Monocytes: 551 cells/uL (ref 200–950)
Basophils Absolute: 27 cells/uL (ref 0–200)
Basophils Relative: 0.4 %
Eosinophils Absolute: 143 cells/uL (ref 15–500)
Eosinophils Relative: 2.1 %
HCT: 40.1 % (ref 38.5–50.0)
Hemoglobin: 13.3 g/dL (ref 13.2–17.1)
Lymphs Abs: 1965 cells/uL (ref 850–3900)
MCH: 30 pg (ref 27.0–33.0)
MCHC: 33.2 g/dL (ref 32.0–36.0)
MCV: 90.5 fL (ref 80.0–100.0)
MPV: 10.4 fL (ref 7.5–12.5)
Monocytes Relative: 8.1 %
Neutro Abs: 4114 cells/uL (ref 1500–7800)
Neutrophils Relative %: 60.5 %
Platelets: 365 10*3/uL (ref 140–400)
RBC: 4.43 10*6/uL (ref 4.20–5.80)
RDW: 13.1 % (ref 11.0–15.0)
Total Lymphocyte: 28.9 %
WBC: 6.8 10*3/uL (ref 3.8–10.8)

## 2022-08-02 LAB — COMPLETE METABOLIC PANEL WITH GFR
AG Ratio: 1.6 (calc) (ref 1.0–2.5)
ALT: 36 U/L (ref 9–46)
AST: 25 U/L (ref 10–40)
Albumin: 4.6 g/dL (ref 3.6–5.1)
Alkaline phosphatase (APISO): 119 U/L (ref 36–130)
BUN: 15 mg/dL (ref 7–25)
CO2: 30 mmol/L (ref 20–32)
Calcium: 9.5 mg/dL (ref 8.6–10.3)
Chloride: 102 mmol/L (ref 98–110)
Creat: 0.61 mg/dL (ref 0.60–1.24)
Globulin: 2.9 g/dL (calc) (ref 1.9–3.7)
Glucose, Bld: 88 mg/dL (ref 65–99)
Potassium: 4.3 mmol/L (ref 3.5–5.3)
Sodium: 138 mmol/L (ref 135–146)
Total Bilirubin: 0.5 mg/dL (ref 0.2–1.2)
Total Protein: 7.5 g/dL (ref 6.1–8.1)
eGFR: 139 mL/min/{1.73_m2} (ref >=60)

## 2022-08-02 LAB — SEDIMENTATION RATE: Sed Rate: 22 mm/h — ABNORMAL HIGH (ref 0–15)

## 2022-08-03 ENCOUNTER — Ambulatory Visit (INDEPENDENT_AMBULATORY_CARE_PROVIDER_SITE_OTHER): Payer: Self-pay | Admitting: Student

## 2022-08-03 VITALS — BP 123/70 | HR 70 | Ht 67.0 in | Wt 171.4 lb

## 2022-08-03 DIAGNOSIS — L600 Ingrowing nail: Secondary | ICD-10-CM

## 2022-08-03 NOTE — Assessment & Plan Note (Signed)
History and exam finding consistent with ingrowing toe nail bilaterally. No acute sign of infection at this time. Will need podiatry for toe nail removal. - Placed referral to Podiatry

## 2022-08-03 NOTE — Progress Notes (Addendum)
    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 Reports prior drainage, redness and pain that has improved since completing antibiotics.  PERTINENT  PMH / PSH: Reviewed   OBJECTIVE:   BP 123/70   Pulse 70   Ht 5\' 7"  (1.702 m)   Wt 171 lb 6.4 oz (77.7 kg)   SpO2 100%   BMI 26.85 kg/m    Physical Exam General: Alert, well appearing, NAD Cardiovascular: RRR, Well perfused Respiratory: Normal WOB on RA  Right third toe: Area of edema and tenderness. Ingrowing nail Left second toe:  Ingrowing nail with edema and tenderness      ASSESSMENT/PLAN:   Ingrown toenail of both feet History and exam finding consistent with ingrowing toe nail bilaterally. No acute sign of infection at this time. Will need podiatry for toe nail removal. - Placed referral to Podiatry    Jerre Simon, MD [facility]

## 2022-08-03 NOTE — Patient Instructions (Signed)
It was nice seeing you today. I am sorry about your ingrown nail. It appears to be a bit complex. I recommend nail removal by a foot specialist. I have placed referral and will try to call for an appointment.  You can also call the number below for your appointment.  [facility] at [facility] [address] [place],  [place]  [address] Get Driving Directions Main: [phone]

## 2022-08-07 ENCOUNTER — Encounter: Payer: Self-pay | Admitting: Internal Medicine

## 2022-08-07 NOTE — Telephone Encounter (Signed)
Patient had labs done on 08/02/2022. Patient has been prescribed Methotrexate 20MG  (8 Tablets) and Meloxicam 15MG . Please advise.

## 2022-08-11 MED ORDER — METHOTREXATE SODIUM 2.5 MG PO TABS
15.0000 mg | ORAL_TABLET | ORAL | 0 refills | Status: AC
Start: 2022-08-11 — End: ?

## 2022-08-11 NOTE — Telephone Encounter (Signed)
Encounter in result note.

## 2022-08-11 NOTE — Telephone Encounter (Signed)
Addressed in result note now.

## 2022-08-11 NOTE — Progress Notes (Signed)
Sedimentation rate increased from 17 to 22.  Blood count and metabolic panel look fine no problems from the increased methotrexate dose. I do not think the higher dose methotrexate is doing any more.  He can decrease the methotrexate back to 6 tablets weekly. If symptoms get any worse along with the numbers we can change medications when we follow up.

## 2022-08-14 ENCOUNTER — Encounter: Payer: Self-pay | Admitting: Podiatry

## 2022-08-14 ENCOUNTER — Ambulatory Visit (INDEPENDENT_AMBULATORY_CARE_PROVIDER_SITE_OTHER): Payer: Self-pay | Admitting: Podiatry

## 2022-08-14 DIAGNOSIS — L600 Ingrowing nail: Secondary | ICD-10-CM

## 2022-08-14 NOTE — Patient Instructions (Signed)

## 2022-08-14 NOTE — Progress Notes (Signed)
Subjective:   Patient ID: Frank Leon., male   DOB: 22 y.o.   MRN: [id number]   HPI Patient presents with painful ingrown toenails of the [place] incurvated nails right  Separate from the nail left that has had for a long time and makes wearing shoes difficult.  He is tried to soak and trim them without relief of symptoms overall in good shape has psoriatic arthritis.  Patient does not smoke likes to be active   Review of Systems  All other systems reviewed and are negative.       Objective:  Physical Exam Vitals and nursing note reviewed.  Constitutional:      Appearance: He is well-developed.  Pulmonary:     Effort: Pulmonary effort is normal.  Musculoskeletal:        General: Normal range of motion.  Skin:    General: Skin is warm.  Neurological:     Mental Status: He is alert.     Neurovascular status intact muscle strength found to be adequate range of motion adequate with patient noted to have incurvated medial border second and third right second and left that are painful when pressed make shoe gear difficult slight redness associated with.  Good digital perfusion     Assessment:  Ingrown toenail deformity second third right second left     Plan:  H&P reviewed recommended correction explained procedure allow him to sign read consent form understanding risk and then I infiltrated each dose toe 60 mg like Marcaine mixture sterile prep done and using sterile instrumentation.  I then applied chemical phenol 3 applications 30 seconds to each border followed by sterile dressing instructed on soaks and reappoint to recheck as needed

## 2022-09-06 ENCOUNTER — Telehealth: Payer: Self-pay | Admitting: Family Medicine

## 2022-09-06 NOTE — Telephone Encounter (Signed)
error 

## 2022-10-20 NOTE — Progress Notes (Signed)
Office Visit Note  Patient: Frank Leon.             Date of Birth: Apr 27, 2000           MRN: [id number]             PCP: Fortunato Curling, DO Referring: Fortunato Curling, DO Visit Date: 11/02/2022   Subjective:  Follow-up (Patient states he has dryness on his scalp.)   History of Present Illness: Frank Brecker. is a 22 y.o. male here for follow up for psoriatic arthritis on methotrexate 15 mg p.o. weekly after last visit and folic acid 1 mg daily and meloxicam 15 mg daily as needed.  Toe pain and swelling is partially improved still keeps diffuse swelling throughout multiple toes on the right foot and a little bit on the left second toe.  Ingrown toenails retreated with podiatry no new complications.  He continues having some active skin rash around the gluteal cleft uses topical triamcinolone estimates about twice weekly.  Also has a new area of rash with dryness and flaking on the top of his head.  Tried using over-the-counter Selsun Blue shampoo without benefit.  Worse joint pain is in the knees this has been a problem throughout but currently getting more pain in the front of the knee worsened by kneeling.  Previous HPI 08/02/2022 Frank Getting. is a 22 y.o. male here for follow up for psoriatic arthritis on methotrexate increased to 20 mg p.o. weekly after last visit and folic acid 1 mg daily and meloxicam 15 mg daily as needed.  Reports joint pain is doing somewhat better but he still has persistent swelling affecting some toes on the right foot.  Also having trouble with ingrown toenails.  Skin rash remains in groin area no definite change with the medication increase.   Previous HPI 05/30/22 Frank Getting. is a 22 y.o. male here for follow up for psoriatic arthritis after starting methotrexate 15mg  PO weekly and folic acid 1 mg daily and meloxicam 15 mg as needed. So far rash is less erythematous but still remains. Toe pain and  swelling persists, noticing ingrown toenails in multiple digits due to the swelling. He feels fatigued for 1 day after taking each methotrexate dose but otherwise tolerating okay.   Previous HPI 04/19/22 Frank Getting. is a 22 y.o. male here for follow up for what appears to be psoriatic arthritis.  Still with ongoing pain and joint swelling affecting toes and his right foot.  Application of the Kenalog on affected area has slightly decreased the scaling and itchiness but the area remains irritated and red coloration has not improved.  The meloxicam is partially helpful for his arthritis.   Previous HPI 03/28/22 Frank Getting. is a 22 y.o. male here for evaluation of pain and swelling in the right foot ongoing for almost 1 year.  He does not recall any preceding injury or events before the start of this swelling.  Initially there was pain then visible swelling and now has some discoloration on the top of the foot and at least 3 or 4 toes are affected.  Was treated with a prednisone taper without a very significant difference in symptoms.  He saw Dr. Pearletha Forge for evaluation with mostly unremarkable laboratory workup besides very slight sed rate elevation of 19.  MRI of the right foot obtained in November demonstrated joint effusions and synovitis in the second through fourth MTP joint and second and third IP joint in the right  foot.  He has been taking the meloxicam 15 mg daily at this significantly improved his pain and stiffness but he does not see a change in the amount of swelling.  More recently he started having some swelling in the second toe of the left foot now he is having some left knee pain and feeling of pressure or difficulty fully flexing the knee.  This has been going on for the past month or 2.  The left knee pain is also improved with the meloxicam he does not see visible swelling. He has no previous history of any major joint injury or joint surgery.  He does  fairly demanding work Engineer, structural. He has noticed new skin rash in the gluteal cleft and inguinal fold areas ongoing for most of the past year as well.  This started after his foot swelling but ongoing for several months at least.  He does not feel any pain or itching just notices the area due to the location and visible discoloration.  He has not tried any specific treatment for this.  No skin rashes elsewhere on the body. Denies any diarrhea or blood in stools.  He has not noticed any eye redness, irritation, or vision changes.   Labs reviewed 11/2021 ANA neg RF neg ESR 19 CRP 3   12/18/21 MRI Right foot IMPRESSION: 1. Second through fourth MTP joint and second and third IP joint effusions with mild synovitis. Findings are nonspecific, but can be seen in the setting of inflammatory arthropathy. 2. No acute osseous abnormality.   Review of Systems  Constitutional:  Negative for fatigue.  HENT:  Negative for mouth sores and mouth dryness.   Eyes:  Positive for dryness.  Respiratory:  Negative for shortness of breath.   Cardiovascular:  Negative for chest pain and palpitations.  Gastrointestinal:  Negative for blood in stool, constipation and diarrhea.  Endocrine: Negative for increased urination.  Genitourinary:  Negative for involuntary urination.  Musculoskeletal:  Positive for joint pain, joint pain, joint swelling and morning stiffness. Negative for gait problem, myalgias, muscle weakness, muscle tenderness and myalgias.  Skin:  Positive for rash. Negative for color change, hair loss and sensitivity to sunlight.  Allergic/Immunologic: Negative for susceptible to infections.  Neurological:  Positive for dizziness and headaches.  Hematological:  Negative for swollen glands.  Psychiatric/Behavioral:  Negative for depressed mood and sleep disturbance. The patient is not nervous/anxious.     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 No pertinent past medical history.  Family History  Problem Relation Age of Onset   Hyperlipidemia Mother    History reviewed. No pertinent surgical history. Social History   Social History Narrative   ** Merged History Encounter **       Immunization History  Administered Date(s) Administered   DTaP 09/06/2000, 10/26/2000, 02/03/2001, 05/22/2002, 08/10/2004   HIB (PRP-OMP) 09/06/2000, 10/26/2000, 02/03/2001, 10/24/2001   HPV 9-valent 06/04/2014, 08/20/2014, 03/16/2016   Hepatitis A 09/22/2005, 11/13/2007   Hepatitis B 01-22-2001, 09/06/2000, 02/08/2001   IPV 09/06/2000, 10/26/2000, 10/24/2001, 08/10/2004   Influenza,inj,Quad PF,6+ Mos 06/04/2014, 03/16/2016   Influenza-Unspecified 04/01/2005, 11/13/2007, 01/14/2009   MMR 10/24/2001, 08/10/2004  Meningococcal Conjugate 06/04/2014   PFIZER(Purple Top)SARS-COV-2 Vaccination 05/22/2019, 06/16/2019   Pneumococcal Conjugate-13 09/06/2000, 10/26/2000, 10/24/2001   Td 09/25/2011   Tdap 09/25/2011   Varicella 10/24/2001, 09/22/2005     Objective: Vital Signs: BP 114/76 (BP Location: Left Arm, Patient Position: Sitting, Cuff Size: Normal)   Pulse 80   Resp 14   Ht 5\' 6"  (1.676 m)   Wt 171 lb (77.6 kg)   BMI 27.60 kg/m    Physical Exam Eyes:     Conjunctiva/sclera: Conjunctivae normal.  Cardiovascular:     Rate and Rhythm: Normal rate and regular rhythm.  Pulmonary:     Effort: Pulmonary effort is normal.     Breath sounds: Normal breath sounds.  Lymphadenopathy:     Cervical: No cervical adenopathy.  Skin:    General: Skin is warm and dry.     Findings: Rash present.     Comments: White erythematous patch at superior  margin of gluteal cleft on backside, slight scaling around the margins 2 to 3 cm diameter patch of rash on top of scalp with faintly erythematous border and overlying scale  Neurological:     Mental Status: He is alert.  Psychiatric:        Mood and Affect: Mood normal.      Musculoskeletal Exam:  Shoulders full ROM no tenderness or swelling Elbows full ROM no tenderness or swelling Wrists full ROM no tenderness or swelling Fingers full ROM no tenderness or swelling Knees full ROM, mild tenderness to pressure at anterior surface along inferior border of patella, no palpable swelling Ankles full ROM no tenderness or swelling MTPs full ROM, left foot 2nd through 4th digits with slight winding down the whole length of digit no localized joint swelling   Investigation: No additional findings.  Imaging: No results found.  Recent Labs: Lab Results  Component Value Date   WBC 6.8 08/02/2022   HGB 13.3 08/02/2022   PLT 365 08/02/2022   NA 138 08/02/2022   K 4.3 08/02/2022   CL 102 08/02/2022   CO2 30 08/02/2022   GLUCOSE 88 08/02/2022   BUN 15 08/02/2022   CREATININE 0.61 08/02/2022   BILITOT 0.5 08/02/2022   ALKPHOS 110 11/24/2021   AST 25 08/02/2022   ALT 36 08/02/2022   PROT 7.5 08/02/2022   ALBUMIN 4.7 11/24/2021   CALCIUM 9.5 08/02/2022    Speciality Comments: No specialty comments available.  Procedures:  No procedures performed Allergies: Patient has no known allergies.   Assessment / Plan:     Visit Diagnoses: Psoriatic arthritis (HCC) - Plan: CBC with Differential/Platelet  Joint inflammation appears well-controlled.  I believe his current knee pain is more prepatellar bursitis and probably occupational related.  Skin rash on gluteal cleft is not completely resolved and has any rash on the scalp.  Currently less suspicious for this being new psoriasis plaques but if not improving with topical therapy will probably need to switch him could be candidate for  sulfasalazine.  For now continue methotrexate 15 mg p.o. weekly folic acid 1 mg daily.  Continue meloxicam 15 mg daily as needed.  High risk medication use - methotrexate may be back down to 15 mg p.o. weekly folic acid 1 mg daily. meloxicam 15 mg daily as needed - Plan: COMPLETE METABOLIC PANEL WITH GFR, Sedimentation rate  Checking CBC and CMP for medication monitoring continue long-term use of methotrexate and meloxicam.  No serious interval infections.  QuantiFERON test was negative.  Ingrown toenail of both feet -  Will see podiatry for additional local management.  Tinea capitis - Plan: ketoconazole (NIZORAL) 2 % cream  Scalp rash looks suggestive for tinea capitis.  Not typically an infection increased with methotrexate treatment.  Recommend use of topical ketoconazole treatment for at least 2 weeks to see if this clears up.  If does not improve or be more suspicious for psoriasis related and could try scalp steroid solution.  Prepatellar bursitis of both knees  Discussed knee pain appears most consistent prepatellar bursitis.  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  Recommend trying to start consistently using kneepads for his work to protect direct trauma to the joint.  Also provided printed rehab exercises.  Orders: Orders Placed This Encounter  Procedures   CBC with Differential/Platelet   COMPLETE METABOLIC PANEL WITH GFR   Sedimentation rate   Meds ordered this encounter  Medications   ketoconazole (NIZORAL) 2 % cream    Sig: Apply to affected area 1-2 times daily, do not wash area for at least 15 minutes afterwards    Dispense:  15 g    Refill:  0     Follow-Up Instructions: Return in about 3 months (around 02/01/2023) for PsA on MTX/NSAID f/u 3mos.   Fuller Plan, MD  Note - This record has been created using [employer].  Chart creation errors have been sought, but may not always  have been located. Such  creation errors do not reflect on  the standard of medical care.

## 2022-11-02 ENCOUNTER — Emergency Department (HOSPITAL_COMMUNITY)
Admission: EM | Admit: 2022-11-02 | Discharge: 2022-11-02 | Disposition: A | Discharge status: Home / Self Care | Payer: Medicaid Other | Attending: Emergency Medicine | Admitting: Emergency Medicine

## 2022-11-02 ENCOUNTER — Encounter (HOSPITAL_COMMUNITY): Payer: Self-pay

## 2022-11-02 ENCOUNTER — Ambulatory Visit: Payer: Medicaid Other | Attending: Internal Medicine | Admitting: Internal Medicine

## 2022-11-02 ENCOUNTER — Encounter: Payer: Self-pay | Admitting: Internal Medicine

## 2022-11-02 ENCOUNTER — Other Ambulatory Visit: Payer: Self-pay

## 2022-11-02 VITALS — BP 114/76 | HR 80 | Resp 14 | Ht 66.0 in | Wt 171.0 lb

## 2022-11-02 DIAGNOSIS — R112 Nausea with vomiting, unspecified: Secondary | ICD-10-CM | POA: Insufficient documentation

## 2022-11-02 DIAGNOSIS — L600 Ingrowing nail: Secondary | ICD-10-CM

## 2022-11-02 DIAGNOSIS — R197 Diarrhea, unspecified: Secondary | ICD-10-CM | POA: Insufficient documentation

## 2022-11-02 DIAGNOSIS — L4050 Arthropathic psoriasis, unspecified: Secondary | ICD-10-CM

## 2022-11-02 DIAGNOSIS — Z79899 Other long term (current) drug therapy: Secondary | ICD-10-CM | POA: Diagnosis not present

## 2022-11-02 DIAGNOSIS — M7041 Prepatellar bursitis, right knee: Secondary | ICD-10-CM

## 2022-11-02 DIAGNOSIS — M7040 Prepatellar bursitis, unspecified knee: Secondary | ICD-10-CM | POA: Insufficient documentation

## 2022-11-02 DIAGNOSIS — E876 Hypokalemia: Secondary | ICD-10-CM | POA: Insufficient documentation

## 2022-11-02 DIAGNOSIS — M7042 Prepatellar bursitis, left knee: Secondary | ICD-10-CM

## 2022-11-02 DIAGNOSIS — B350 Tinea barbae and tinea capitis: Secondary | ICD-10-CM

## 2022-11-02 LAB — CBC
HCT: 43.1 % (ref 39.0–52.0)
Hemoglobin: 13.8 g/dL (ref 13.0–17.0)
MCH: 29.2 pg (ref 26.0–34.0)
MCHC: 32 g/dL (ref 30.0–36.0)
MCV: 91.3 fL (ref 80.0–100.0)
Platelets: 324 10*3/uL (ref 150–400)
RBC: 4.72 MIL/uL (ref 4.22–5.81)
RDW: 12.9 % (ref 11.5–15.5)
WBC: 6 10*3/uL (ref 4.0–10.5)
nRBC: 0 % (ref 0.0–0.2)

## 2022-11-02 LAB — COMPREHENSIVE METABOLIC PANEL
ALT: 49 U/L — ABNORMAL HIGH (ref 0–44)
AST: 28 U/L (ref 15–41)
Albumin: 4.1 g/dL (ref 3.5–5.0)
Alkaline Phosphatase: 95 U/L (ref 38–126)
Anion gap: 11 (ref 5–15)
BUN: 15 mg/dL (ref 6–20)
CO2: 26 mmol/L (ref 22–32)
Calcium: 9.2 mg/dL (ref 8.9–10.3)
Chloride: 98 mmol/L (ref 98–111)
Creatinine, Ser: 0.7 mg/dL (ref 0.61–1.24)
GFR, Estimated: >60 mL/min (ref >60)
Glucose, Bld: 113 mg/dL — ABNORMAL HIGH (ref 70–99)
Potassium: 3.4 mmol/L — ABNORMAL LOW (ref 3.5–5.1)
Sodium: 135 mmol/L (ref 135–145)
Total Bilirubin: 0.9 mg/dL (ref 0.3–1.2)
Total Protein: 7.5 g/dL (ref 6.5–8.1)

## 2022-11-02 LAB — URINALYSIS, ROUTINE W REFLEX MICROSCOPIC
Bilirubin Urine: NEGATIVE
Glucose, UA: NEGATIVE mg/dL
Hgb urine dipstick: NEGATIVE
Ketones, ur: NEGATIVE mg/dL
Leukocytes,Ua: NEGATIVE
Nitrite: NEGATIVE
Protein, ur: NEGATIVE mg/dL
Specific Gravity, Urine: 1.021 (ref 1.005–1.030)
pH: 5 (ref 5.0–8.0)

## 2022-11-02 LAB — LIPASE, BLOOD: Lipase: 24 U/L (ref 11–51)

## 2022-11-02 MED ORDER — KETOCONAZOLE 2 % EX CREA
TOPICAL_CREAM | CUTANEOUS | 0 refills | Status: DC
Start: 2022-11-02 — End: 2023-02-01

## 2022-11-02 MED ORDER — LOPERAMIDE HCL 2 MG PO CAPS: 2.0000 mg | ORAL_CAPSULE | Freq: Four times a day (QID) | ORAL | 0 refills | Status: AC | PRN

## 2022-11-02 NOTE — Patient Instructions (Signed)
I recommend trying to use knee pads more consistently during work especially involving kneeling on hard surfaces such as stone, concrete or hardwood. I attached some information about this prepatellar bursitis problem.

## 2022-11-02 NOTE — ED Triage Notes (Signed)
Pt c/o N/V/D started last night.

## 2022-11-02 NOTE — ED Provider Notes (Addendum)
[name] EMERGENCY DEPARTMENT AT [facility] Provider Note   CSN: [id number] Arrival date & time: 11/02/22  1326     History  Chief Complaint  Patient presents with   Emesis   Diarrhea    Frank Leon. is a 22 y.o. male with history of psoriatic arthritis, presenting with nausea and diarrhea that started last night.  States the diarrhea is nonbloody and probably had about 10 episodes in 24 hours.  Also notes some nausea but no active vomiting today.  Denies any sick contacts, new foods or medications.  Denies any recent antibiotic use.  No abdominal pain, fever, or chills.  Traveled to [place] and returned on 9/5.    Emesis Associated symptoms: diarrhea   Diarrhea Associated symptoms: vomiting        Home Medications Prior to Admission medications   Medication Sig Start Date End Date Taking? Authorizing Provider  loperamide (IMODIUM) 2 MG capsule Take 1 capsule (2 mg total) by mouth 4 (four) times daily as needed for up to 2 days for diarrhea or loose stools. 11/02/22 11/04/22 Yes Arabella Merles, PA-C  cephALEXin (KEFLEX) 500 MG capsule Take 500 mg by mouth 4 (four) times daily. Patient not taking: Reported on 11/02/2022    [provider]  diclofenac Sodium (VOLTAREN ARTHRITIS PAIN) 1 % GEL Apply 4 g topically 4 (four) times daily. Patient not taking: Reported on 08/02/2022 06/23/22   Levin Erp, MD  folic acid (FOLVITE) 1 MG tablet TAKE 1 TABLET BY MOUTH EVERY DAY 07/11/22   Rice, Jamesetta Orleans, MD  ketoconazole (NIZORAL) 2 % cream Apply to affected area 1-2 times daily, do not wash area for at least 15 minutes afterwards 11/02/22   Rice, Jamesetta Orleans, MD  meloxicam (MOBIC) 15 MG tablet Take 1 tablet (15 mg total) by mouth daily as needed for pain. 04/19/22   Rice, Jamesetta Orleans, MD  methotrexate (RHEUMATREX) 2.5 MG tablet Take 6 tablets (15 mg total) by mouth once a week. Caution:Chemotherapy. Protect from light. 08/11/22   Rice,  Jamesetta Orleans, MD  triamcinolone cream (KENALOG) 0.1 % Apply 1 Application topically 2 (two) times daily as needed. Leave at least 15 minutes after application. For up to 1 month. Patient not taking: Reported on 11/02/2022 03/28/22   Fuller Plan, MD      Allergies    Patient has no known allergies.    Review of Systems   Review of Systems  Gastrointestinal:  Positive for diarrhea and vomiting.    Physical Exam Updated Vital Signs BP 123/69 (BP Location: Right Arm)   Pulse 69   Temp 98.2 F (36.8 C)   Resp 16   Ht 5\' 6"  (1.676 m)   Wt 77.6 kg   SpO2 100%   BMI 27.61 kg/m  Physical Exam Vitals and nursing note reviewed.  Constitutional:      General: He is not in acute distress.    Appearance: He is well-developed.     Comments: No active vomiting, well-appearing  HENT:     Head: Normocephalic and atraumatic.  Eyes:     Conjunctiva/sclera: Conjunctivae normal.  Cardiovascular:     Rate and Rhythm: Normal rate and regular rhythm.     Heart sounds: No murmur heard. Pulmonary:     Effort: Pulmonary effort is normal. No respiratory distress.     Breath sounds: Normal breath sounds.  Abdominal:     Palpations: Abdomen is soft.     Tenderness: There is no abdominal tenderness.  Comments: No abdominal tenderness to palpation diffusely  Musculoskeletal:        General: No swelling.     Cervical back: Neck supple.  Skin:    General: Skin is warm and dry.     Capillary Refill: Capillary refill takes less than 2 seconds.  Neurological:     Mental Status: He is alert.  Psychiatric:        Mood and Affect: Mood normal.     ED Results / Procedures / Treatments   Labs (all labs ordered are listed, but only abnormal results are displayed) Labs Reviewed  COMPREHENSIVE METABOLIC PANEL - Abnormal; Notable for the following components:      Result Value   Potassium 3.4 (*)    Glucose, Bld 113 (*)    ALT 49 (*)    All other components within normal limits   LIPASE, BLOOD  CBC  URINALYSIS, ROUTINE W REFLEX MICROSCOPIC    EKG None  Radiology No results found.  Procedures Procedures    Medications Ordered in ED Medications - No data to display  ED Course/ Medical Decision Making/ A&P                                 Medical Decision Making Amount and/or Complexity of Data Reviewed Labs: ordered.  Risk Prescription drug management.   22 y.o. male with pertinent past medical history of psoriatic arthritis presents to the ED for concern of nonbloody diarrhea that started yesterday  Differential diagnosis includes but is not limited to parasitic infection, infectious diarrhea, gastroenteritis, antibiotic induced diarrhea, pancreatitis, cholecystitis, appendicitis, diverticulitis  ED Course:  Patient overall well-appearing with completely normal vital signs.  No abdominal tenderness to palpation, no active vomiting.  Does not report any diarrhea here in the ER.  Patient unable to provide stool sample in ER for testing.  Given he returned from [place] 2 weeks ago, traveler's diarrhea or parasitic diarrhea cannot be excluded. CMP with slight hypokalemia at 3.4, otherwise urinalysis, lipase, CBC all within normal limits.  Low suspicion for any acute abdominal pathology at time.  No need for further imaging at this time.  Stable and appropriate for discharge, no further workup indicated at this time.  Impression: Non-bloody diarrhea  Disposition:  The patient was discharged home with instructions to take loperamide as needed for diarrhea over the next 48 hours, but no longer than 48 hours.  Keep hydrated with electrolyte solution such as Pedialyte. Strict return precautions given to return if diarrhea not improving, or if development of any bloody diarrhea, worsening abdominal pain, fever chills, any other new or concerning symptoms  Lab Tests: I Ordered, and personally interpreted labs.  The pertinent results include:   CMP with  slight hypokalemia at 3.4 CBC without any leukocytosis Lipase within normal limits Urinalysis unremarkable  External records from outside source obtained and reviewed including rheumatologist visit from earlier today   Co morbidities that complicate the patient evaluation  Psoriatic arthritis              Final Clinical Impression(s) / ED Diagnoses Final diagnoses:  Diarrhea, unspecified type    Rx / DC Orders ED Discharge Orders          Ordered    loperamide (IMODIUM) 2 MG capsule  4 times daily PRN        11/02/22 1901  Arabella Merles, PA-C 11/02/22 1925    Arabella Merles, PA-C 11/02/22 1952    Terrilee Files, MD 11/03/22 [phone]

## 2022-11-02 NOTE — ED Provider Triage Note (Signed)
Emergency Medicine Provider Triage Evaluation Note  [name]. , a 22 y.o. male  was evaluated in triage.  Pt complains of nausea, vomiting, diarrhea that started yesterday evening.  States the diarrhea is nonbloody.  Denies any fever or chills.  Denies any new medications or new foods.  It does note travel to [place] and he returned September 5.  Mild left-sided abdominal pain.  Review of Systems  Positive: As above Negative: As above  Physical Exam  BP (!) 148/96 (BP Location: Right Arm)   Pulse 81   Temp 98.4 F (36.9 C) (Oral)   Resp 14   Ht 5\' 6"  (1.676 m)   Wt 77.6 kg   SpO2 97%   BMI 27.61 kg/m  Gen:   Awake, no distress   Resp:  Normal effort  MSK:   Moves extremities without difficulty    Medical Decision Making  Medically screening exam initiated at 1:46 PM.  Appropriate orders placed.  [employer]. was informed that the remainder of the evaluation will be completed by another provider, this initial triage assessment does not replace that evaluation, and the importance of remaining in the ED until their evaluation is complete.     Arabella Merles, PA-C 11/02/22 1347

## 2022-11-02 NOTE — Discharge Instructions (Addendum)
Your labs are reassuring today.  Your potassium is slightly low at 3.4.  Please keep hydrated with electrolyte drinks that contain sodium and potassium such as Pedialyte.  Eat a bland diet with foods such as rice or toast to help with your nausea.  You have been prescribed loperamide to use every 6 hours as needed for diarrhea. Do not use for longer than 48 hours.  Return to the ER if your diarrhea worsens, you have bloody diarrhea, you develop fever or chills, or any other new or concerning symptoms.

## 2022-11-03 LAB — COMPLETE METABOLIC PANEL WITH GFR
AG Ratio: 1.5 (calc) (ref 1.0–2.5)
ALT: 47 U/L — ABNORMAL HIGH (ref 9–46)
AST: 24 U/L (ref 10–40)
Albumin: 4.8 g/dL (ref 3.6–5.1)
Alkaline phosphatase (APISO): 123 U/L (ref 36–130)
BUN: 18 mg/dL (ref 7–25)
CO2: 27 mmol/L (ref 20–32)
Calcium: 9.6 mg/dL (ref 8.6–10.3)
Chloride: 98 mmol/L (ref 98–110)
Creat: 0.73 mg/dL (ref 0.60–1.24)
Globulin: 3.1 g/dL (calc) (ref 1.9–3.7)
Glucose, Bld: 91 mg/dL (ref 65–99)
Potassium: 4.1 mmol/L (ref 3.5–5.3)
Sodium: 136 mmol/L (ref 135–146)
Total Bilirubin: 1 mg/dL (ref 0.2–1.2)
Total Protein: 7.9 g/dL (ref 6.1–8.1)
eGFR: 132 mL/min/{1.73_m2} (ref >=60)

## 2022-11-03 LAB — CBC WITH DIFFERENTIAL/PLATELET
Absolute Monocytes: 481 cells/uL (ref 200–950)
Basophils Absolute: 7 cells/uL (ref 0–200)
Basophils Relative: 0.1 %
Eosinophils Absolute: 81 cells/uL (ref 15–500)
Eosinophils Relative: 1.1 %
HCT: 45.9 % (ref 38.5–50.0)
Hemoglobin: 14.8 g/dL (ref 13.2–17.1)
Lymphs Abs: 599 cells/uL — ABNORMAL LOW (ref 850–3900)
MCH: 29.5 pg (ref 27.0–33.0)
MCHC: 32.2 g/dL (ref 32.0–36.0)
MCV: 91.4 fL (ref 80.0–100.0)
MPV: 10.5 fL (ref 7.5–12.5)
Monocytes Relative: 6.5 %
Neutro Abs: 6231 cells/uL (ref 1500–7800)
Neutrophils Relative %: 84.2 %
Platelets: 359 10*3/uL (ref 140–400)
RBC: 5.02 10*6/uL (ref 4.20–5.80)
RDW: 13.2 % (ref 11.0–15.0)
Total Lymphocyte: 8.1 %
WBC: 7.4 10*3/uL (ref 3.8–10.8)

## 2022-11-03 LAB — SEDIMENTATION RATE: Sed Rate: 14 mm/h (ref 0–15)

## 2022-11-03 NOTE — Progress Notes (Signed)
Sedimentation rate improved to 14 from 22 this is now normal.  One of his liver enzyme test the ALT is 1 point outside of normal.  His lymphocyte count is slightly low at 599 but total white blood cells are normal. I do not think this is very significant, we can just monitor it.

## 2022-11-11 ENCOUNTER — Other Ambulatory Visit: Payer: Self-pay | Admitting: Internal Medicine

## 2022-11-11 DIAGNOSIS — L4050 Arthropathic psoriasis, unspecified: Secondary | ICD-10-CM

## 2022-11-13 MED ORDER — METHOTREXATE SODIUM 2.5 MG PO TABS
15.0000 mg | ORAL_TABLET | ORAL | 0 refills | Status: DC
Start: 2022-11-13 — End: 2023-02-01

## 2022-11-13 NOTE — Telephone Encounter (Signed)
Last Fill: 08/11/2022  Labs: 11/02/2022  Potassium 3.4 Glucose 113 ALT 49  Next Visit: 02/01/2023  Last Visit: 11/02/2022  DX: Psoriatic arthritis   Current Dose per office note 11/02/2022: methotrexate 15 mg p.o. weekly   Okay to refill Methotrexate?

## 2022-12-22 ENCOUNTER — Other Ambulatory Visit: Payer: Self-pay | Admitting: Internal Medicine

## 2022-12-22 DIAGNOSIS — L4050 Arthropathic psoriasis, unspecified: Secondary | ICD-10-CM

## 2023-01-19 NOTE — Progress Notes (Signed)
Office Visit Note  Patient: Frank Leon.             Date of Birth: [birth year]           MRN: [id number]             PCP: Fortunato Curling, DO Referring: Fortunato Curling, DO Visit Date: 02/01/2023   Subjective:  Follow-up   Discussed the use of AI scribe software for clinical note transcription with the patient, who gave verbal consent to proceed.  History of Present Illness   Frank Basta. is a 22 y.o. male here for follow up for psoriatic arthritis on methotrexate 15 mg p.o. weekly after last visit and folic acid 1 mg daily and meloxicam 15 mg daily as needed.  He presents with persistent rashes and joint swelling. He reports that the rashes improve with the use of topical medication but reappear when the application is discontinued. The patient also reports intermittent knee pain, which has improved recently, possibly due to reduced physical strain and the use of knee pads. He denies any new areas of swelling but notes that the existing swelling in the toes has not completely resolved, although it has decreased slightly. The patient also reports a recent sensation of an impending sore throat, which he has been managing with cough drops. He denies any other recent illnesses. The patient has been on methotrexate for several months, but the effectiveness of this medication in controlling his symptoms is unclear.    Previous HPI 11/02/2022 Frank Getting. is a 22 y.o. male here for follow up for psoriatic arthritis on methotrexate 15 mg p.o. weekly after last visit and folic acid 1 mg daily and meloxicam 15 mg daily as needed.  Toe pain and swelling is partially improved still keeps diffuse swelling throughout multiple toes on the right foot and a little bit on the left second toe.  Ingrown toenails retreated with podiatry no new complications.  He continues having some active skin rash around the gluteal cleft uses topical triamcinolone estimates  about twice weekly.  Also has a new area of rash with dryness and flaking on the top of his head.  Tried using over-the-counter Selsun Blue shampoo without benefit.  Worse joint pain is in the knees this has been a problem throughout but currently getting more pain in the front of the knee worsened by kneeling.   Previous HPI 08/02/2022 Frank Getting. is a 23 y.o. male here for follow up for psoriatic arthritis on methotrexate increased to 20 mg p.o. weekly after last visit and folic acid 1 mg daily and meloxicam 15 mg daily as needed.  Reports joint pain is doing somewhat better but he still has persistent swelling affecting some toes on the right foot.  Also having trouble with ingrown toenails.  Skin rash remains in groin area no definite change with the medication increase.   Previous HPI 05/30/22 Frank Getting. is a 22 y.o. male here for follow up for psoriatic arthritis after starting methotrexate 15mg  PO weekly and folic acid 1 mg daily and meloxicam 15 mg as needed. So far rash is less erythematous but still remains. Toe pain and swelling persists, noticing ingrown toenails in multiple digits due to the swelling. He feels fatigued for 1 day after taking each methotrexate dose but otherwise tolerating okay.   Previous HPI 04/19/22 Frank Getting. is a 22 y.o. male here for follow up for what appears to be psoriatic arthritis.  Still with ongoing  pain and joint swelling affecting toes and his right foot.  Application of the Kenalog on affected area has slightly decreased the scaling and itchiness but the area remains irritated and red coloration has not improved.  The meloxicam is partially helpful for his arthritis.   Previous HPI 03/28/22 Frank Getting. is a 22 y.o. male here for evaluation of pain and swelling in the right foot ongoing for almost 1 year.  He does not recall any preceding injury or events before the start of this  swelling.  Initially there was pain then visible swelling and now has some discoloration on the top of the foot and at least 3 or 4 toes are affected.  Was treated with a prednisone taper without a very significant difference in symptoms.  He saw Dr. Pearletha Forge for evaluation with mostly unremarkable laboratory workup besides very slight sed rate elevation of 19.  MRI of the right foot obtained in November demonstrated joint effusions and synovitis in the second through fourth MTP joint and second and third IP joint in the right foot.  He has been taking the meloxicam 15 mg daily at this significantly improved his pain and stiffness but he does not see a change in the amount of swelling.  More recently he started having some swelling in the second toe of the left foot now he is having some left knee pain and feeling of pressure or difficulty fully flexing the knee.  This has been going on for the past month or 2.  The left knee pain is also improved with the meloxicam he does not see visible swelling. He has no previous history of any major joint injury or joint surgery.  He does fairly demanding work Engineer, structural. He has noticed new skin rash in the gluteal cleft and inguinal fold areas ongoing for most of the past year as well.  This started after his foot swelling but ongoing for several months at least.  He does not feel any pain or itching just notices the area due to the location and visible discoloration.  He has not tried any specific treatment for this.  No skin rashes elsewhere on the body. Denies any diarrhea or blood in stools.  He has not noticed any eye redness, irritation, or vision changes.   Labs reviewed 11/2021 ANA neg RF neg ESR 19 CRP 3   12/18/21 MRI Right foot IMPRESSION: 1. Second through fourth MTP joint and second and third IP joint effusions with mild synovitis. Findings are nonspecific, but can be seen in the setting of inflammatory arthropathy. 2. No acute osseous  abnormality.   Review of Systems  Constitutional:  Negative for fatigue.  HENT:  Negative for mouth sores and mouth dryness.   Eyes:  Negative for dryness.  Respiratory:  Negative for shortness of breath.   Cardiovascular:  Negative for chest pain and palpitations.  Gastrointestinal:  Negative for blood in stool, constipation and diarrhea.  Endocrine: Negative for increased urination.  Genitourinary:  Negative for involuntary urination.  Musculoskeletal:  Positive for joint pain, joint pain, joint swelling, myalgias, morning stiffness and myalgias. Negative for gait problem, muscle weakness and muscle tenderness.  Skin:  Positive for rash. Negative for color change, hair loss and sensitivity to sunlight.  Allergic/Immunologic: Negative for susceptible to infections.  Neurological:  Positive for dizziness and headaches.  Hematological:  Negative for swollen glands.  Psychiatric/Behavioral:  Negative for depressed mood and sleep disturbance. The patient is not nervous/anxious.     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 No pertinent past medical history.  Family History  Problem Relation Age of Onset   Hyperlipidemia Mother    Past Surgical History:  Procedure Laterality Date   WISDOM TOOTH EXTRACTION     Social History   Social History Narrative   ** Merged History Encounter **       Immunization History  Administered Date(s) Administered   DTaP 09/06/2000, 10/26/2000, 02/03/2001, 05/22/2002, 08/10/2004   HIB (PRP-OMP) 09/06/2000, 10/26/2000, 02/03/2001, 10/24/2001   HPV 9-valent 06/04/2014, 08/20/2014, 03/16/2016   Hepatitis A  09/22/2005, 11/13/2007   Hepatitis B Mar [birth year], 2000, 09/06/2000, 02/08/2001   IPV 09/06/2000, 10/26/2000, 10/24/2001, 08/10/2004   Influenza,inj,Quad PF,6+ Mos 06/04/2014, 03/16/2016   Influenza-Unspecified 04/01/2005, 11/13/2007, 01/14/2009   MMR 10/24/2001, 08/10/2004   Meningococcal Conjugate 06/04/2014   PFIZER(Purple Top)SARS-COV-2 Vaccination 05/22/2019, 06/16/2019   Pneumococcal Conjugate-13 09/06/2000, 10/26/2000, 10/24/2001   Td 09/25/2011   Tdap 09/25/2011   Varicella 10/24/2001, 09/22/2005     Objective: Vital Signs: BP 112/72 (BP Location: Left Arm, Patient Position: Sitting, Cuff Size: Normal)   Pulse (!) 55   Resp 14   Ht 5\' 7"  (1.702 m)   Wt 177 lb (80.3 kg)   BMI 27.72 kg/m    Physical Exam Cardiovascular:     Rate and Rhythm: Normal rate and regular rhythm.  Pulmonary:     Effort: Pulmonary effort is normal.     Breath sounds: Normal breath sounds.  Skin:    General: Skin is warm and dry.     Comments: Round erythematous rash on scalp, faint central clearing, no scale no alopecia  Neurological:     Mental Status: He is alert.  Psychiatric:        Mood and Affect: Mood normal.      Musculoskeletal Exam:  Shoulders full ROM no tenderness or swelling Wrists full ROM no tenderness or swelling Fingers full ROM no tenderness or swelling Knees full ROM no tenderness or swelling Ankles full ROM no tenderness or swelling Right foot toes 2nd-4th enlarged along length no focal tenderness, joint ROM normal  Investigation: No additional findings.  Imaging: No results found.  Recent Labs: Lab Results  Component Value Date   WBC 6.0 11/02/2022   HGB 13.8 11/02/2022   PLT 324 11/02/2022   NA 135 11/02/2022   K 3.4 (L) 11/02/2022   CL 98 11/02/2022   CO2 26 11/02/2022   GLUCOSE 113 (H) 11/02/2022   BUN 15 11/02/2022   CREATININE 0.70 11/02/2022   BILITOT 0.9 11/02/2022   ALKPHOS 95 11/02/2022   AST 28 11/02/2022   ALT 49 (H) 11/02/2022   PROT 7.5  11/02/2022   ALBUMIN 4.1 11/02/2022   CALCIUM 9.2 11/02/2022    Speciality Comments: No specialty comments available.  Procedures:  No procedures performed Allergies: Patient has no known allergies.   Assessment / Plan:     Visit Diagnoses: Psoriatic arthritis (HCC) - Plan: meloxicam (MOBIC) 15 MG tablet Persistent skin rashes that improve with topical medication use but recur when medication is discontinued. Chronic thickening of joint and tendon linings due to repeated inflammation. No significant improvement noted with  Methotrexate. -Discontinue Methotrexate due to lack of significant benefit. -Continue using topical medications for skin rashes. -Monitor symptoms and contact office if any increase in symptoms or flare-ups occur. -Consider alternative oral medication (Sulfasalazine) if symptoms worsen.  Rash and other nonspecific skin eruption - Plan: triamcinolone cream (KENALOG) 0.1 %  Tinea capitis - Plan: ketoconazole (NIZORAL) 2 % cream Scalp rash looks more suggestive for tinea versus psoriasis recommend trial of topical ketoconazole. If not improving can try resuming steroid with clobetasol soln.  Ingrown toenail of both feet  Prepatellar bursitis of both knees Improvement noted, possibly due to reduced strain and use of knee pads. -Continue current management strategies including reduced strain and use of knee pads.    Orders: No orders of the defined types were placed in this encounter.  Meds ordered this encounter  Medications   meloxicam (MOBIC) 15 MG tablet    Sig: Take 1 tablet (15 mg total) by mouth daily as needed for pain.    Dispense:  30 tablet    Refill:  1   triamcinolone cream (KENALOG) 0.1 %    Sig: Apply 1 Application topically 2 (two) times daily as needed. Leave at least 15 minutes after application.    Dispense:  30 g    Refill:  1   ketoconazole (NIZORAL) 2 % cream    Sig: Apply topically 2 (two) times daily as needed for irritation. Leave in  place at least 15 minutes    Dispense:  15 g    Refill:  1     Follow-Up Instructions: Return in about 6 months (around 08/02/2023) for PsA/PsO topicals/NSAIDs f/u 6mos.   Fuller Plan, MD  Note - This record has been created using [employer].  Chart creation errors have been sought, but may not always  have been located. Such creation errors do not reflect on  the standard of medical care.

## 2023-02-01 ENCOUNTER — Ambulatory Visit: Payer: Medicaid Other | Attending: Internal Medicine | Admitting: Internal Medicine

## 2023-02-01 ENCOUNTER — Encounter: Payer: Self-pay | Admitting: Internal Medicine

## 2023-02-01 VITALS — BP 112/72 | HR 55 | Resp 14 | Ht 67.0 in | Wt 177.0 lb

## 2023-02-01 DIAGNOSIS — R21 Rash and other nonspecific skin eruption: Secondary | ICD-10-CM

## 2023-02-01 DIAGNOSIS — Z79899 Other long term (current) drug therapy: Secondary | ICD-10-CM

## 2023-02-01 DIAGNOSIS — B350 Tinea barbae and tinea capitis: Secondary | ICD-10-CM

## 2023-02-01 DIAGNOSIS — L4050 Arthropathic psoriasis, unspecified: Secondary | ICD-10-CM

## 2023-02-01 DIAGNOSIS — L600 Ingrowing nail: Secondary | ICD-10-CM | POA: Diagnosis not present

## 2023-02-01 DIAGNOSIS — M7041 Prepatellar bursitis, right knee: Secondary | ICD-10-CM | POA: Diagnosis not present

## 2023-02-01 DIAGNOSIS — M7042 Prepatellar bursitis, left knee: Secondary | ICD-10-CM

## 2023-02-01 MED ORDER — MELOXICAM 15 MG PO TABS
15.0000 mg | ORAL_TABLET | Freq: Every day | ORAL | 1 refills | Status: DC | PRN
Start: 2023-02-01 — End: 2023-08-03

## 2023-02-01 MED ORDER — TRIAMCINOLONE ACETONIDE 0.1 % EX CREA
1.0000 | TOPICAL_CREAM | Freq: Two times a day (BID) | CUTANEOUS | 1 refills | Status: DC | PRN
Start: 2023-02-01 — End: 2023-08-03

## 2023-02-01 MED ORDER — KETOCONAZOLE 2 % EX CREA
TOPICAL_CREAM | Freq: Two times a day (BID) | CUTANEOUS | 1 refills | Status: AC | PRN
Start: 2023-02-01 — End: ?

## 2023-07-07 IMAGING — DX DG FOOT COMPLETE 3+V*L*
3 series · 3 of 3 positions shown · non-contrast
Comparison: None Available.

CLINICAL DATA: Second MTP joint pain

EXAM:
LEFT FOOT - COMPLETE 3+ VIEW

[dg foot complete left (1 of 3)]
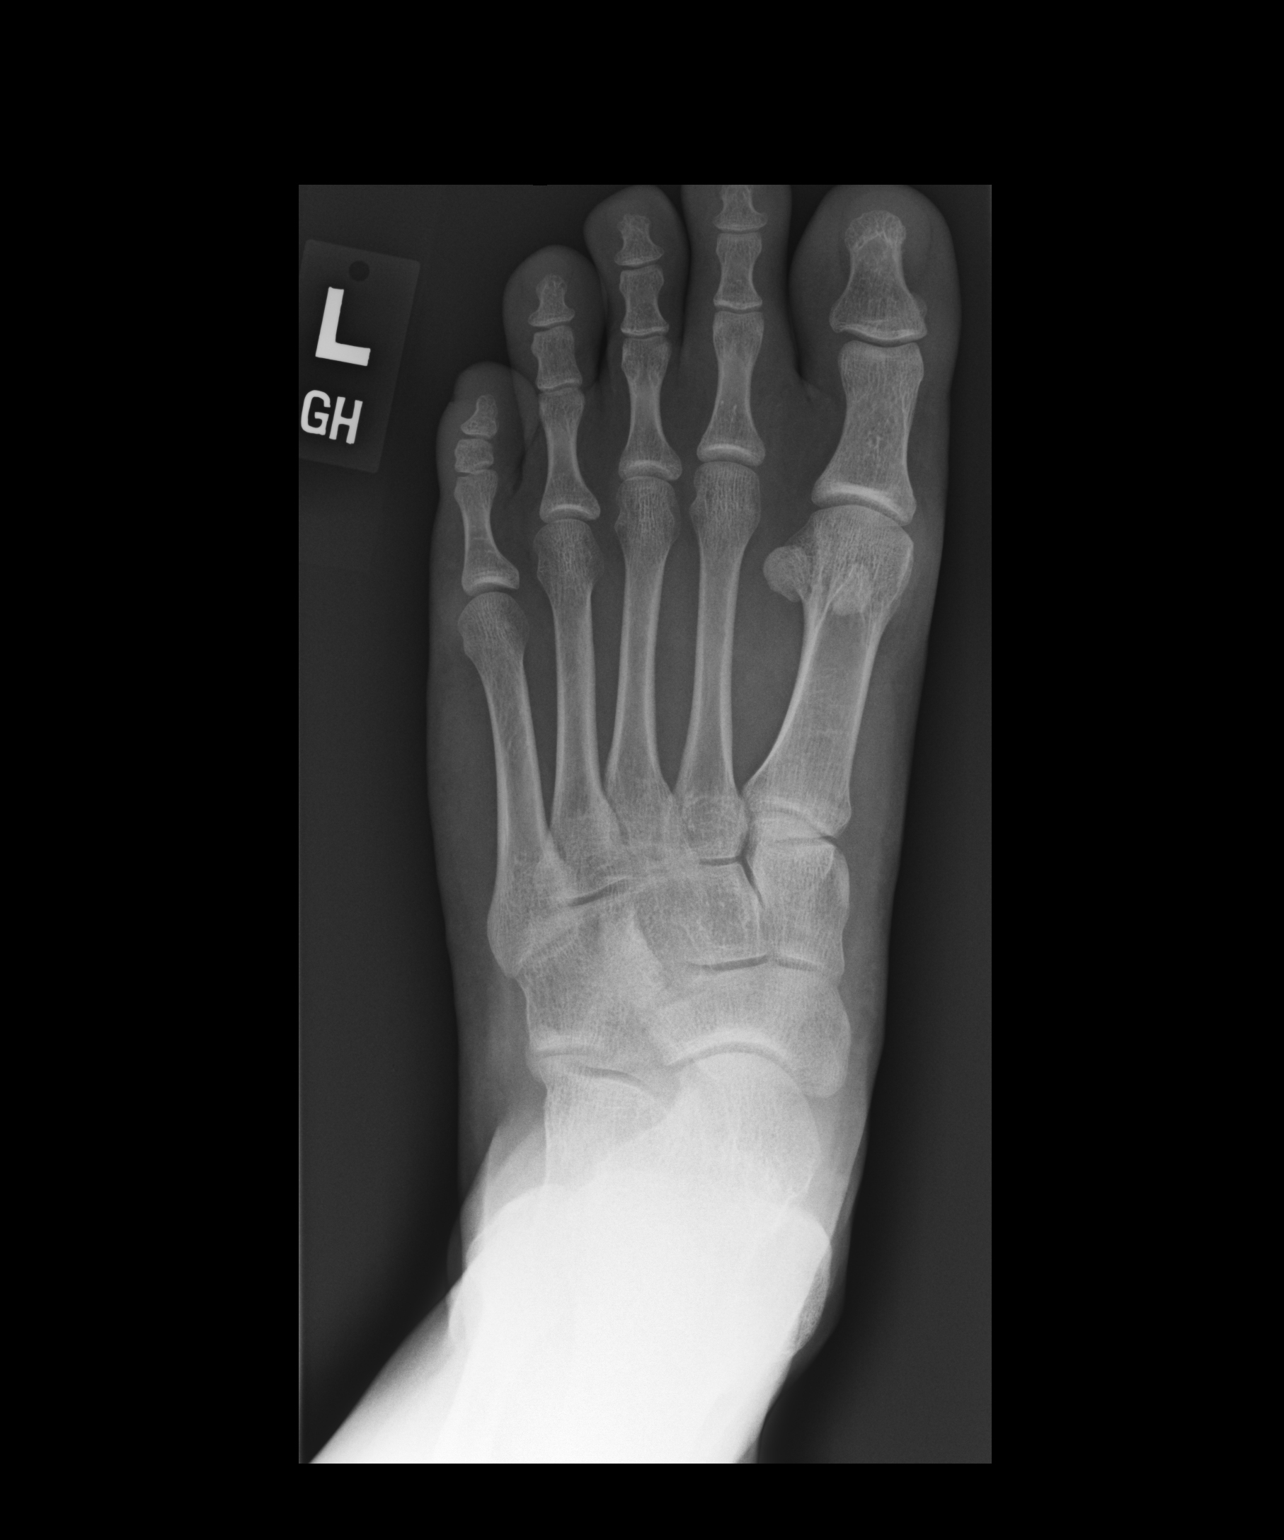

[dg foot complete left (2 of 3)]
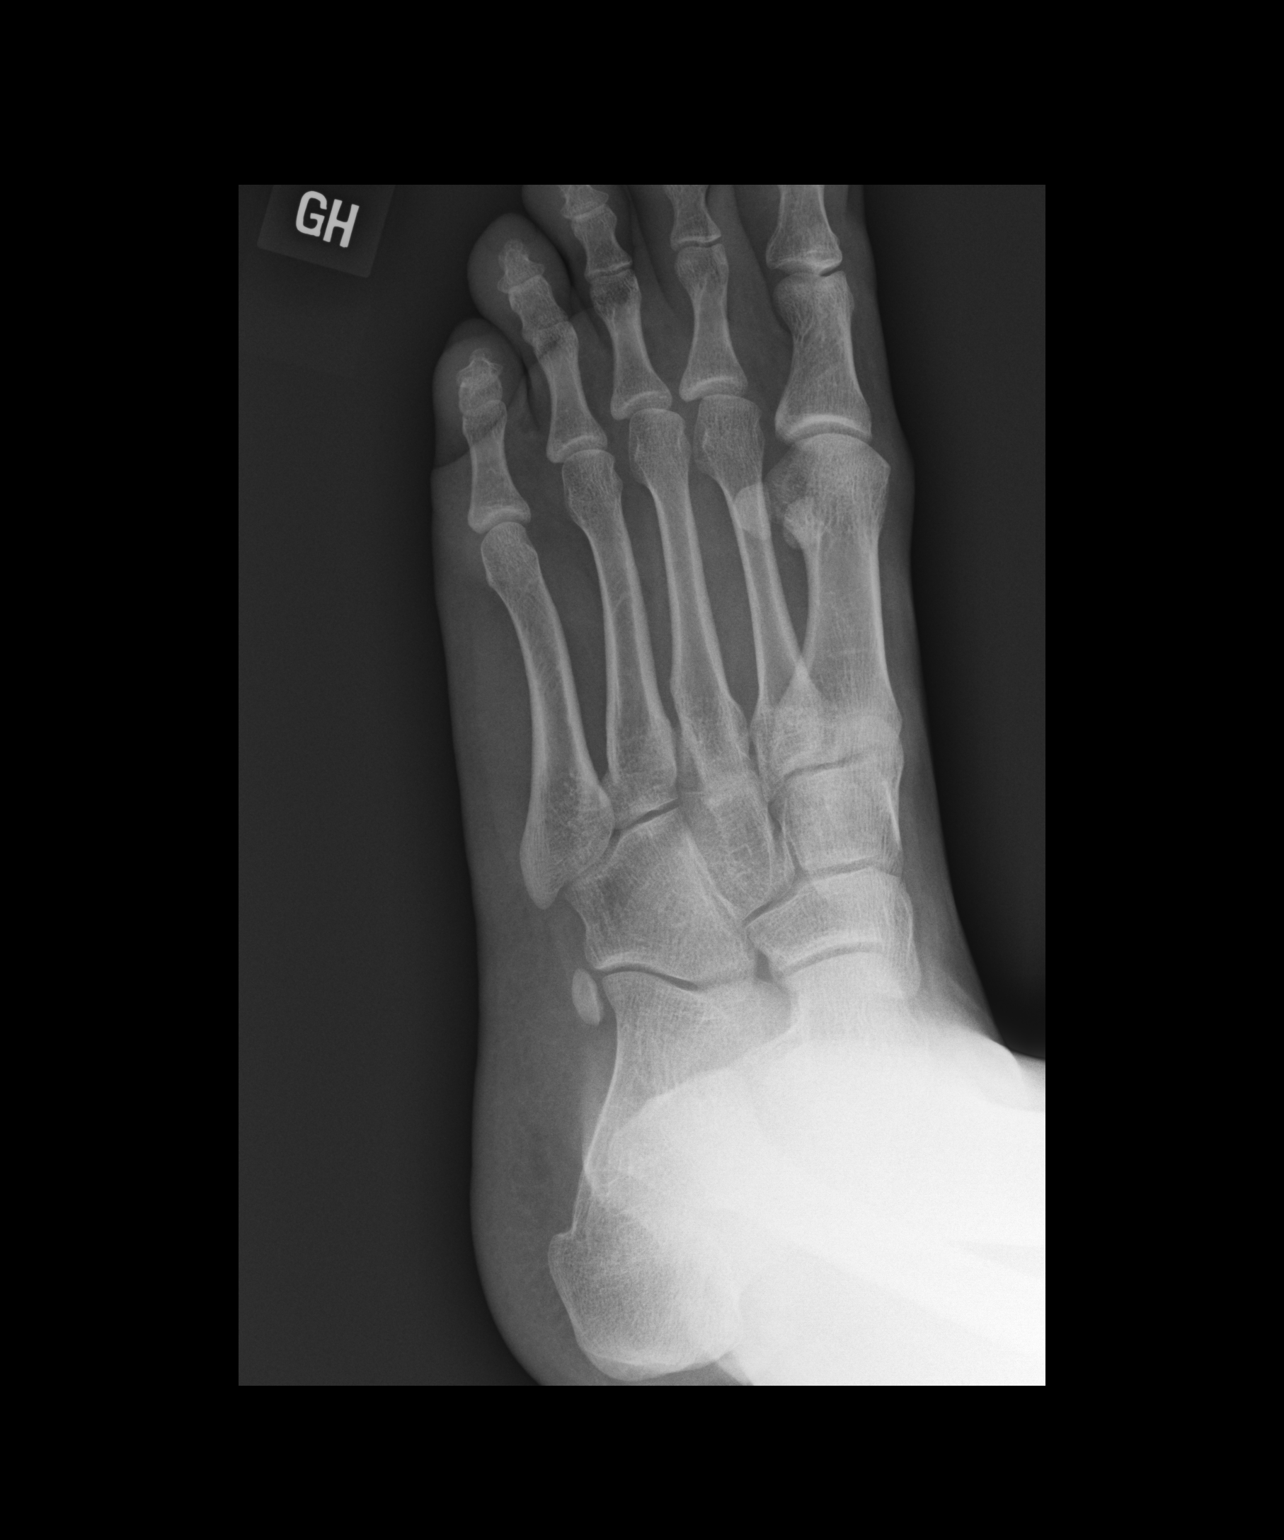

[dg foot complete left (3 of 3)]
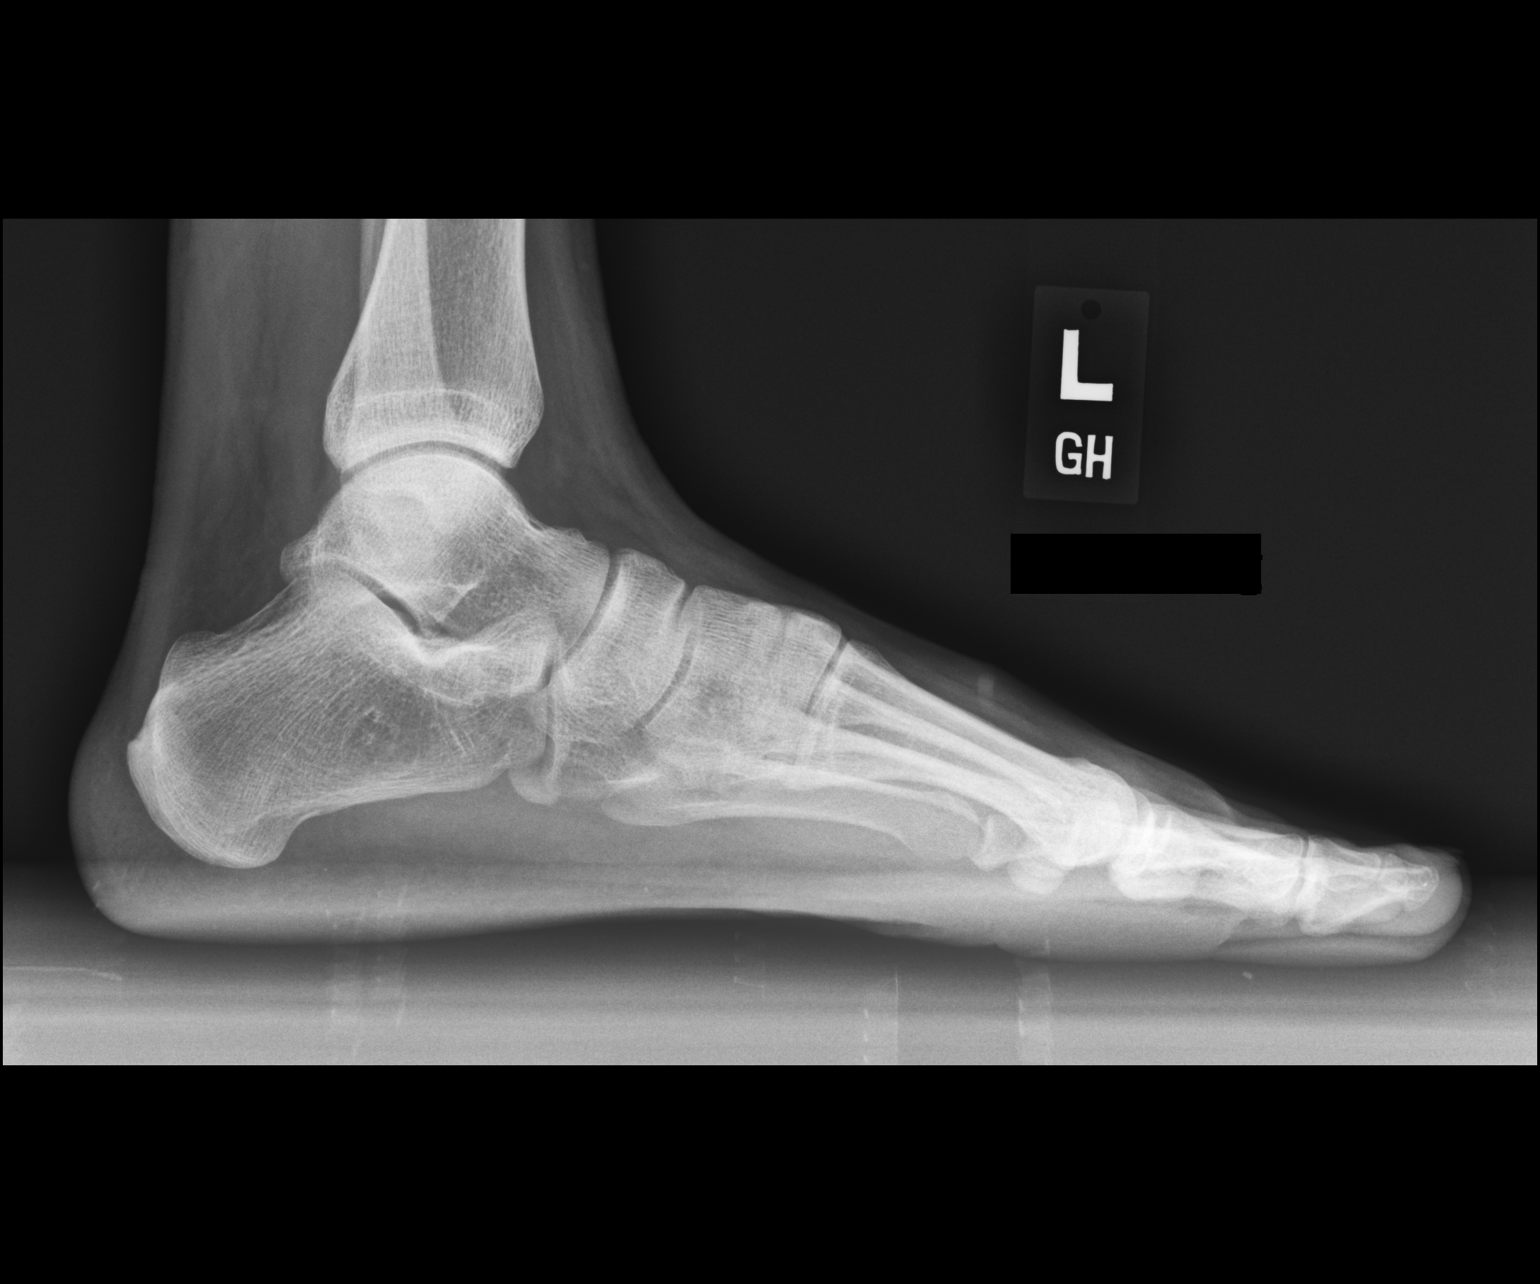

[3 of 3 positions shown; findings below may reference images not displayed]

FINDINGS: There is no evidence of fracture or dislocation. There is no
evidence of arthropathy or other focal bone abnormality. Soft
tissues are unremarkable.
IMPRESSION: Negative.

## 2023-07-07 IMAGING — DX DG FOOT 2V*R*
2 series · 2 of 2 positions shown · non-contrast
Comparison: 11/28/2017

CLINICAL DATA: Swelling

EXAM:
RIGHT FOOT - 2 VIEW

[dg foot 2 views right (1 of 2)]
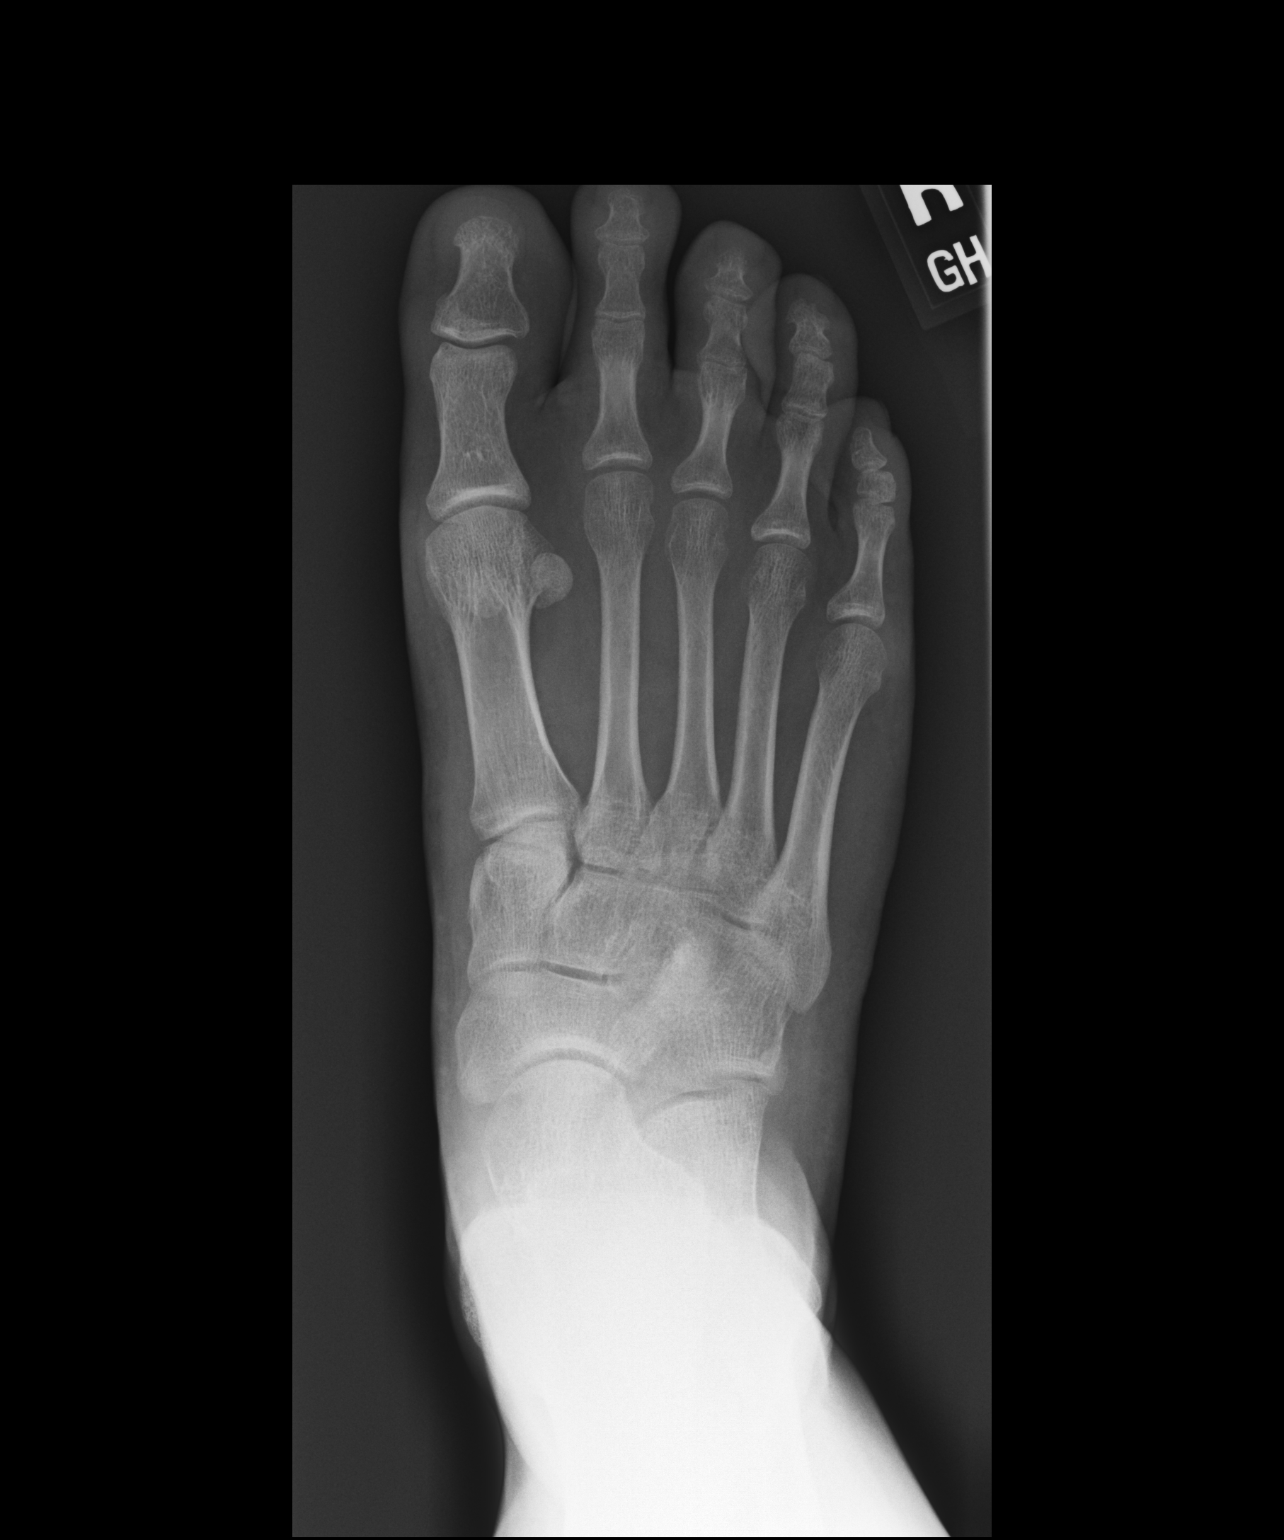

[dg foot 2 views right (2 of 2)]
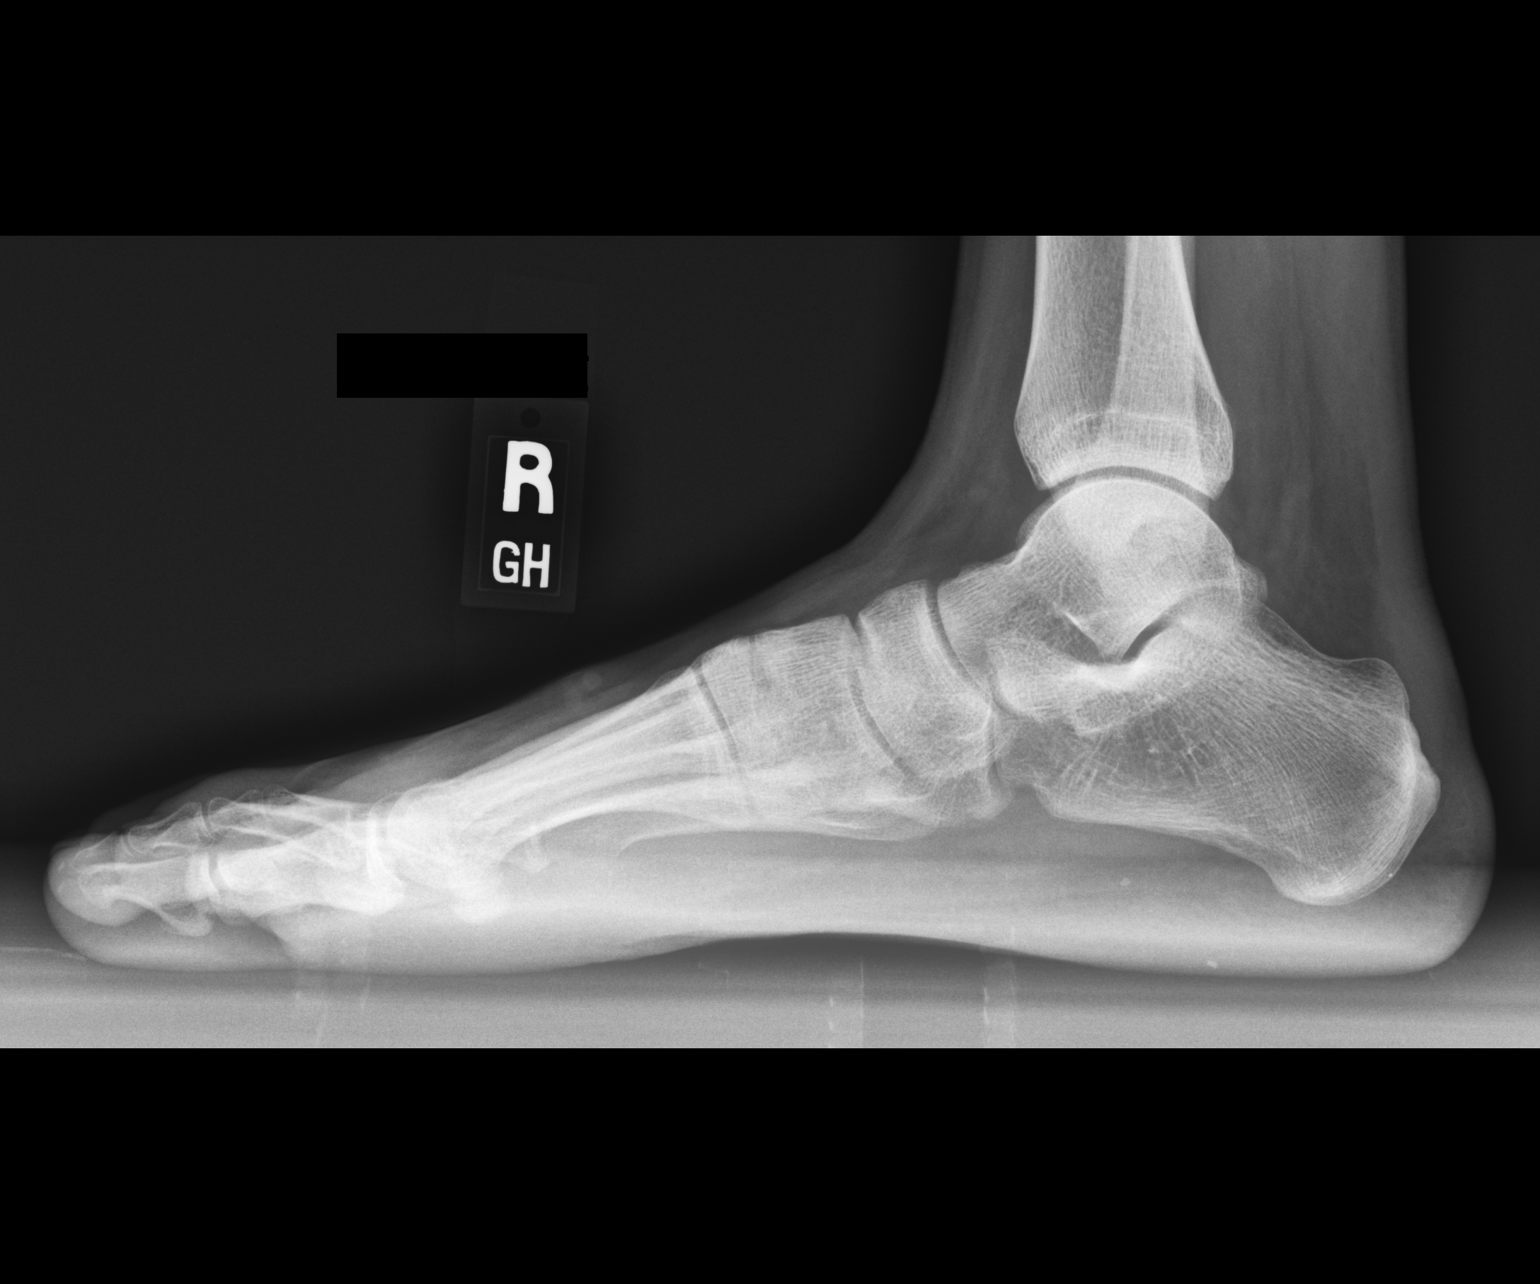

[2 of 2 positions shown; findings below may reference images not displayed]

FINDINGS: No fracture or malalignment. No joint space narrowing. Heads of the
second through fourth metatarsals appear slightly demineralized.
There is dorsal soft tissue swelling at the level of the MTP joints.
IMPRESSION: Soft tissue swelling dorsally at the level of MTP joints with
possible osteopenia of the head of the second through fourth
metatarsals but no fracture or other acute osseous abnormality is
seen.

## 2023-07-20 NOTE — Progress Notes (Unsigned)
 Office Visit Note  Patient: Frank Toledo-Valenzuela Jr.             Date of Birth: [birth year]           MRN: [id number]             PCP: Clyda Dark, DO Referring: Clyda Dark, DO Visit Date: 08/03/2023   Subjective:  Follow-up (Patient states he feels his scalp scab has gotten bigger. Patient states his other rash has not gone away. )   History of Present Illness: Frank Schack. is a 23 y.o. male here for follow up for psoriatic arthritis on meloxicam  15 mg daily as needed.    Previous HPI 02/01/2023 Frank Contes. is a 23 y.o. male here for follow up for psoriatic arthritis on methotrexate  15 mg p.o. weekly after last visit and folic acid  1 mg daily and meloxicam  15 mg daily as needed.  He presents with persistent rashes and joint swelling. He reports that the rashes improve with the use of topical medication but reappear when the application is discontinued. The patient also reports intermittent knee pain, which has improved recently, possibly due to reduced physical strain and the use of knee pads. He denies any new areas of swelling but notes that the existing swelling in the toes has not completely resolved, although it has decreased slightly. The patient also reports a recent sensation of an impending sore throat, which he has been managing with cough drops. He denies any other recent illnesses. The patient has been on methotrexate  for several months, but the effectiveness of this medication in controlling his symptoms is unclear.      Previous HPI 11/02/2022 Frank Contes. is a 23 y.o. male here for follow up for psoriatic arthritis on methotrexate  15 mg p.o. weekly after last visit and folic acid  1 mg daily and meloxicam  15 mg daily as needed.  Toe pain and swelling is partially improved still keeps diffuse swelling throughout multiple toes on the right foot and a little bit on the left second toe.  Ingrown toenails retreated with  podiatry no new complications.  He continues having some active skin rash around the gluteal cleft uses topical triamcinolone  estimates about twice weekly.  Also has a new area of rash with dryness and flaking on the top of his head.  Tried using over-the-counter Selsun Blue shampoo without benefit.  Worse joint pain is in the knees this has been a problem throughout but currently getting more pain in the front of the knee worsened by kneeling.   Previous HPI 08/02/2022 Frank Contes. is a 23 y.o. male here for follow up for psoriatic arthritis on methotrexate  increased to 20 mg p.o. weekly after last visit and folic acid  1 mg daily and meloxicam  15 mg daily as needed.  Reports joint pain is doing somewhat better but he still has persistent swelling affecting some toes on the right foot.  Also having trouble with ingrown toenails.  Skin rash remains in groin area no definite change with the medication increase.   Previous HPI 05/30/22 Frank Contes. is a 23 y.o. male here for follow up for psoriatic arthritis after starting methotrexate  15mg  PO weekly and folic acid  1 mg daily and meloxicam  15 mg as needed. So far rash is less erythematous but still remains. Toe pain and swelling persists, noticing ingrown toenails in multiple digits due to the swelling. He feels fatigued for 1 day after taking each methotrexate  dose but otherwise tolerating okay.  Previous HPI 04/19/22 Frank Contes. is a 23 y.o. male here for follow up for what appears to be psoriatic arthritis.  Still with ongoing pain and joint swelling affecting toes and his right foot.  Application of the Kenalog  on affected area has slightly decreased the scaling and itchiness but the area remains irritated and red coloration has not improved.  The meloxicam  is partially helpful for his arthritis.   Previous HPI 03/28/22 Frank Contes. is a 23 y.o. male here for evaluation of pain and  swelling in the right foot ongoing for almost 1 year.  He does not recall any preceding injury or events before the start of this swelling.  Initially there was pain then visible swelling and now has some discoloration on the top of the foot and at least 3 or 4 toes are affected.  Was treated with a prednisone  taper without a very significant difference in symptoms.  He saw Dr. Peggy Bowens for evaluation with mostly unremarkable laboratory workup besides very slight sed rate elevation of 19.  MRI of the right foot obtained in November demonstrated joint effusions and synovitis in the second through fourth MTP joint and second and third IP joint in the right foot.  He has been taking the meloxicam  15 mg daily at this significantly improved his pain and stiffness but he does not see a change in the amount of swelling.  More recently he started having some swelling in the second toe of the left foot now he is having some left knee pain and feeling of pressure or difficulty fully flexing the knee.  This has been going on for the past month or 2.  The left knee pain is also improved with the meloxicam  he does not see visible swelling. He has no previous history of any major joint injury or joint surgery.  He does fairly demanding work Engineer, structural. He has noticed new skin rash in the gluteal cleft and inguinal fold areas ongoing for most of the past year as well.  This started after his foot swelling but ongoing for several months at least.  He does not feel any pain or itching just notices the area due to the location and visible discoloration.  He has not tried any specific treatment for this.  No skin rashes elsewhere on the body. Denies any diarrhea or blood in stools.  He has not noticed any eye redness, irritation, or vision changes.   Labs reviewed 11/2021 ANA neg RF neg ESR 19 CRP 3   12/18/21 MRI Right foot IMPRESSION: 1. Second through fourth MTP joint and second and third IP joint effusions with  mild synovitis. Findings are nonspecific, but can be seen in the setting of inflammatory arthropathy. 2. No acute osseous abnormality.   Review of Systems  Constitutional:  Negative for fatigue.  HENT:  Negative for mouth sores and mouth dryness.   Eyes:  Negative for dryness.  Respiratory:  Positive for shortness of breath.   Cardiovascular:  Negative for chest pain and palpitations.  Gastrointestinal:  Negative for blood in stool, constipation and diarrhea.  Endocrine: Negative for increased urination.  Genitourinary:  Negative for involuntary urination.  Musculoskeletal:  Positive for joint pain, joint pain, joint swelling and morning stiffness. Negative for gait problem, myalgias, muscle weakness, muscle tenderness and myalgias.  Skin:  Positive for rash. Negative for color change, hair loss and sensitivity to sunlight.  Allergic/Immunologic: Negative for susceptible to infections.  Neurological:  Negative for dizziness and headaches.  Hematological:  Negative for swollen glands.  Psychiatric/Behavioral:  Negative for depressed mood and sleep disturbance. The patient is not nervous/anxious.     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   Acne vulgaris 08/21/2014   Nevus of face 08/21/2014    Past Medical History:  Diagnosis Date   Psoriatic arthritis (HCC)     Family History  Problem Relation Age of Onset   Hyperlipidemia Mother    Past Surgical History:  Procedure Laterality Date   WISDOM TOOTH EXTRACTION     Social History   Social History Narrative   ** Merged History Encounter **       Immunization History  Administered Date(s) Administered   DTaP 09/06/2000, 10/26/2000, 02/03/2001, 05/22/2002,  08/10/2004   HIB (PRP-OMP) 09/06/2000, 10/26/2000, 02/03/2001, 10/24/2001   HPV 9-valent 06/04/2014, 08/20/2014, 03/16/2016   Hepatitis A 09/22/2005, 11/13/2007   Hepatitis B January 24, 2001, 09/06/2000, 02/08/2001   IPV 09/06/2000, 10/26/2000, 10/24/2001, 08/10/2004   Influenza,inj,Quad PF,6+ Mos 06/04/2014, 03/16/2016   Influenza-Unspecified 04/01/2005, 11/13/2007, 01/14/2009   MMR 10/24/2001, 08/10/2004   Meningococcal Conjugate 06/04/2014   PFIZER(Purple Top)SARS-COV-2 Vaccination 05/22/2019, 06/16/2019   Pneumococcal Conjugate-13 09/06/2000, 10/26/2000, 10/24/2001   Td 09/25/2011   Tdap 09/25/2011   Varicella 10/24/2001, 09/22/2005     Objective: Vital Signs: BP 113/74 (BP Location: Left Arm, Patient Position: Sitting, Cuff Size: Normal)   Pulse 63   Resp 14   Ht 5' 7 (1.702 m)   Wt 188 lb (85.3 kg)   BMI 29.44 kg/m    Physical Exam  Eyes:     Conjunctiva/sclera: Conjunctivae normal.    Cardiovascular:     Rate and Rhythm: Normal rate and regular rhythm.  Pulmonary:     Effort: Pulmonary effort is normal.     Breath sounds: Normal breath sounds.   Skin:    General: Skin is warm and dry.     Findings: Rash present.     Comments: Mild erythema on scalp without scale Gluteal cleft well demarcated erythematous patch Mild tenderness left side, possibly inguinal lymph node no cyst no overlying lesion   Neurological:     Mental Status: He is alert.   Psychiatric:        Mood and Affect: Mood normal.      Musculoskeletal Exam:  Shoulders full ROM no tenderness or swelling Elbows full ROM no tenderness or swelling Wrists full ROM no tenderness or swelling Fingers full ROM no tenderness or swelling Hip normal internal and external rotation without pain, no tenderness to lateral hip palpation Knees full ROM no tenderness or swelling Right ankle with palpable achilles tendon swelling at ankle insertion Right 2nd-4th toes dactylitis, 3rd DIP tender and  swelling   Investigation: No additional findings.  Imaging: No results found.  Recent Labs: Lab Results  Component Value Date   WBC 6.0 11/02/2022   HGB 13.8 11/02/2022   PLT 324 11/02/2022   NA 135 11/02/2022   K 3.4 (L) 11/02/2022   CL 98 11/02/2022   CO2 26 11/02/2022   GLUCOSE 113 (H) 11/02/2022   BUN 15 11/02/2022   CREATININE 0.70 11/02/2022   BILITOT 0.9 11/02/2022   ALKPHOS 95 11/02/2022   AST 28 11/02/2022   ALT 49 (H) 11/02/2022  PROT 7.5 11/02/2022   ALBUMIN 4.1 11/02/2022   CALCIUM 9.2 11/02/2022    Speciality Comments: No specialty comments available.  Procedures:  No procedures performed Allergies: Patient has no known allergies.   Assessment / Plan:     Visit Diagnoses: Psoriatic arthritis (HCC) - Discontinue Methotrexate  due to lack of significant benefit. - Plan: Sedimentation rate, C-reactive protein, meloxicam  (MOBIC ) 15 MG tablet  Rash and other nonspecific skin eruption - triamcinolone  cream (KENALOG ) 0.1 % - Plan: triamcinolone  cream (KENALOG ) 0.1 %  Tinea capitis - If not improving can try resuming steroid with clobetasol soln.  Ingrown toenail of both feet  Prepatellar bursitis of both knees  High risk medication use - Plan: CBC with Differential/Platelet, Comprehensive metabolic panel with GFR, QuantiFERON-TB Gold Plus  Rash and other nonspecific skin eruption - Plan: triamcinolone  cream (KENALOG ) 0.1 %  Psoriatic arthritis (HCC) - Plan: Sedimentation rate, C-reactive protein, meloxicam  (MOBIC ) 15 MG tablet  Tinea capitis  ***  Orders: Orders Placed This Encounter  Procedures   Sedimentation rate   C-reactive protein   CBC with Differential/Platelet   Comprehensive metabolic panel with GFR   QuantiFERON-TB Gold Plus   Meds ordered this encounter  Medications   triamcinolone  cream (KENALOG ) 0.1 %    Sig: Apply 1 Application topically 2 (two) times daily as needed. Leave at least 15 minutes after application.    Dispense:   30 g    Refill:  1   meloxicam  (MOBIC ) 15 MG tablet    Sig: Take 1 tablet (15 mg total) by mouth daily as needed for pain.    Dispense:  30 tablet    Refill:  1     Follow-Up Instructions: Return in about 3 months (around 11/03/2023) for PsA otezla start f/u 3mos.   Matt Song, MD  Note - This record has been created using [employer].  Chart creation errors have been sought, but may not always  have been located. Such creation errors do not reflect on  the standard of medical care.

## 2023-08-03 ENCOUNTER — Ambulatory Visit: Payer: Medicaid Other | Attending: Internal Medicine | Admitting: Internal Medicine

## 2023-08-03 ENCOUNTER — Encounter: Payer: Self-pay | Admitting: Internal Medicine

## 2023-08-03 ENCOUNTER — Telehealth: Payer: Self-pay

## 2023-08-03 VITALS — BP 113/74 | HR 63 | Resp 14 | Ht 67.0 in | Wt 188.0 lb

## 2023-08-03 DIAGNOSIS — M7042 Prepatellar bursitis, left knee: Secondary | ICD-10-CM | POA: Insufficient documentation

## 2023-08-03 DIAGNOSIS — R21 Rash and other nonspecific skin eruption: Secondary | ICD-10-CM | POA: Insufficient documentation

## 2023-08-03 DIAGNOSIS — M7041 Prepatellar bursitis, right knee: Secondary | ICD-10-CM | POA: Diagnosis present

## 2023-08-03 DIAGNOSIS — L4050 Arthropathic psoriasis, unspecified: Secondary | ICD-10-CM | POA: Diagnosis present

## 2023-08-03 DIAGNOSIS — L600 Ingrowing nail: Secondary | ICD-10-CM | POA: Insufficient documentation

## 2023-08-03 DIAGNOSIS — Z79899 Other long term (current) drug therapy: Secondary | ICD-10-CM | POA: Insufficient documentation

## 2023-08-03 DIAGNOSIS — B350 Tinea barbae and tinea capitis: Secondary | ICD-10-CM | POA: Diagnosis present

## 2023-08-03 MED ORDER — MELOXICAM 15 MG PO TABS: 15.0000 mg | ORAL_TABLET | Freq: Every day | ORAL | 1 refills | Status: AC | PRN

## 2023-08-03 MED ORDER — TRIAMCINOLONE ACETONIDE 0.1 % EX CREA: 1.0000 | TOPICAL_CREAM | Freq: Two times a day (BID) | CUTANEOUS | 1 refills | Status: AC | PRN

## 2023-08-03 NOTE — Telephone Encounter (Signed)
 Patient will be Otezla new start. Pending baseline labs and OV note from today to be signed  Geraldene Kleine, PharmD, MPH, BCPS, CPP Clinical Pharmacist (Rheumatology and Pulmonology)

## 2023-08-06 ENCOUNTER — Telehealth: Payer: Self-pay

## 2023-08-06 NOTE — Telephone Encounter (Signed)
 error

## 2023-08-06 NOTE — Telephone Encounter (Signed)
 Patient called the office to see why his prescription was not sent to the pharmacy, advised that it will be initiated after baseline labs and the OV note has been signed. Is patient waiting for an approval as well ? Please advise

## 2023-08-06 NOTE — Telephone Encounter (Addendum)
 We are waiting for OV note to be signed by Dr. Jeannetta and TB gold to result.  Will take several days to process  Sherry Pennant, PharmD, MPH, BCPS, CPP Clinical Pharmacist (Rheumatology and Pulmonology)=

## 2023-08-08 LAB — QUANTIFERON-TB GOLD PLUS
Mitogen-NIL: >10 [IU]/mL
NIL: 0.02 [IU]/mL
QuantiFERON-TB Gold Plus: NEGATIVE
TB1-NIL: 0 [IU]/mL
TB2-NIL: 0 [IU]/mL

## 2023-08-08 LAB — CBC WITH DIFFERENTIAL/PLATELET
Absolute Lymphocytes: 1836 {cells}/uL (ref 850–3900)
Absolute Monocytes: 421 {cells}/uL (ref 200–950)
Basophils Absolute: 31 {cells}/uL (ref 0–200)
Basophils Relative: 0.6 %
Eosinophils Absolute: 109 {cells}/uL (ref 15–500)
Eosinophils Relative: 2.1 %
HCT: 40.3 % (ref 38.5–50.0)
Hemoglobin: 13 g/dL — ABNORMAL LOW (ref 13.2–17.1)
MCH: 28.4 pg (ref 27.0–33.0)
MCHC: 32.3 g/dL (ref 32.0–36.0)
MCV: 88.2 fL (ref 80.0–100.0)
MPV: 10.8 fL (ref 7.5–12.5)
Monocytes Relative: 8.1 %
Neutro Abs: 2803 {cells}/uL (ref 1500–7800)
Neutrophils Relative %: 53.9 %
Platelets: 311 10*3/uL (ref 140–400)
RBC: 4.57 10*6/uL (ref 4.20–5.80)
RDW: 13.7 % (ref 11.0–15.0)
Total Lymphocyte: 35.3 %
WBC: 5.2 10*3/uL (ref 3.8–10.8)

## 2023-08-08 LAB — COMPREHENSIVE METABOLIC PANEL WITH GFR
AG Ratio: 1.6 (calc) (ref 1.0–2.5)
ALT: 29 U/L (ref 9–46)
AST: 22 U/L (ref 10–40)
Albumin: 4.6 g/dL (ref 3.6–5.1)
Alkaline phosphatase (APISO): 105 U/L (ref 36–130)
BUN: 10 mg/dL (ref 7–25)
CO2: 25 mmol/L (ref 20–32)
Calcium: 9.5 mg/dL (ref 8.6–10.3)
Chloride: 103 mmol/L (ref 98–110)
Creat: 0.61 mg/dL (ref 0.60–1.24)
Globulin: 2.8 g/dL (ref 1.9–3.7)
Glucose, Bld: 107 mg/dL — ABNORMAL HIGH (ref 65–99)
Potassium: 4.2 mmol/L (ref 3.5–5.3)
Sodium: 138 mmol/L (ref 135–146)
Total Bilirubin: 0.3 mg/dL (ref 0.2–1.2)
Total Protein: 7.4 g/dL (ref 6.1–8.1)
eGFR: 138 mL/min/{1.73_m2} (ref >=60)

## 2023-08-08 LAB — SEDIMENTATION RATE: Sed Rate: 9 mm/h (ref 0–15)

## 2023-08-08 LAB — C-REACTIVE PROTEIN: CRP: <3 mg/L (ref <8.0)

## 2023-08-08 NOTE — Telephone Encounter (Signed)
 TB gold negative. PA pending OV note to be signed

## 2023-08-09 NOTE — Telephone Encounter (Signed)
 Submitted an URGENT Prior Authorization request to [facility] [place] Medicaid for OTEZLA via CoverMyMeds. Will update once we receive a response.  Key: [id number]

## 2023-08-10 MED ORDER — APREMILAST 10 & 20 & 30 MG PO TBPK
ORAL_TABLET | ORAL | 0 refills | Status: DC
Start: 2023-08-10 — End: 2023-11-02
  Filled 2023-08-13: qty 55, 28d supply, fill #0

## 2023-08-10 MED ORDER — APREMILAST 30 MG PO TABS
30.0000 mg | ORAL_TABLET | Freq: Two times a day (BID) | ORAL | 0 refills | Status: DC
Start: 2023-08-10 — End: 2023-11-02
  Filled 2023-08-13 – 2023-09-07 (×3): qty 180, 90d supply, fill #0

## 2023-08-10 NOTE — Telephone Encounter (Signed)
 Received notification from [facility] [place] Medicaid regarding a prior authorization for OTEZLA. Authorization has been APPROVED from 08/09/2023 to 08/08/2024. Approval letter sent to scan center.  Copay through [facility] should be $4  Patient can fill through [facility]: [phone]   Authorization # [id number]  Rx sent to [facility] for onboarding. ATC patient but unable to reach. Left detailed VM that Alwin would be reaching out to schedule shipment to home  Frank Leon, PharmD, MPH, BCPS, CPP Clinical Pharmacist (Rheumatology and Pulmonology)

## 2023-08-13 ENCOUNTER — Other Ambulatory Visit: Payer: Self-pay

## 2023-08-13 ENCOUNTER — Other Ambulatory Visit (HOSPITAL_COMMUNITY): Payer: Self-pay

## 2023-08-13 NOTE — Progress Notes (Signed)
 Specialty Pharmacy Initial Fill Coordination Note  Frank Leon. is a 23 y.o. male contacted today regarding initial fill of specialty medication(s) Apremilast   Patient requested Delivery   Delivery date: 08/15/23   Verified address: [address]  [place] [address]   Medication will be filled on 08/14/23.   Patient is aware of $4 copayment and would like to bill [facility] account.

## 2023-08-14 ENCOUNTER — Other Ambulatory Visit: Payer: Self-pay

## 2023-08-20 ENCOUNTER — Other Ambulatory Visit: Payer: Self-pay

## 2023-09-03 ENCOUNTER — Other Ambulatory Visit: Payer: Self-pay

## 2023-09-03 NOTE — Progress Notes (Unsigned)
 Submitted an URGENT Prior Authorization request to [facility] [place] Medicaid for OTEZLA  (maintenance dose) via CoverMyMeds. Will update once we receive a response.  Key: [id number]

## 2023-09-04 ENCOUNTER — Other Ambulatory Visit: Payer: Self-pay

## 2023-09-04 ENCOUNTER — Other Ambulatory Visit (HOSPITAL_COMMUNITY): Payer: Self-pay

## 2023-09-04 NOTE — Progress Notes (Signed)
 Received notification from [facility] Medicaid regarding a prior authorization for OTEZLA . Authorization has been APPROVED from 09/03/2023 to 09/02/2024. Approval letter sent to scan center.  Patient can continue to fill through [facility]: [phone]   Authorization # [id number]  Sherry Pennant, PharmD, MPH, BCPS, CPP Clinical Pharmacist (Rheumatology and Pulmonology)

## 2023-09-07 ENCOUNTER — Other Ambulatory Visit (HOSPITAL_COMMUNITY): Payer: Self-pay

## 2023-09-07 ENCOUNTER — Other Ambulatory Visit: Payer: Self-pay

## 2023-09-07 NOTE — Progress Notes (Signed)
 Specialty Pharmacy Refill Coordination Note  Frank Leon. is a 23 y.o. male contacted today regarding refills of specialty medication(s) Apremilast    Patient requested Delivery   Delivery date: 09/11/23   Verified address: [address]  [name] [id number]   Medication will be filled on 07.28.25.

## 2023-09-10 ENCOUNTER — Other Ambulatory Visit: Payer: Self-pay

## 2023-10-19 NOTE — Progress Notes (Signed)
 Office Visit Note  Patient: Frank Toledo-Valenzuela Jr.             Date of Birth: 2000-05-17           MRN: [id number]             PCP: Janna Ferrier, DO Referring: Janna Ferrier, DO Visit Date: 11/02/2023   Subjective:  Medical Management of Chronic Issues (Right hip pain. )  Discussed the use of AI scribe software for clinical note transcription with the patient, who gave verbal consent to proceed.  History of Present Illness   Frank Vejar. is a 23 y.o. male here for follow up with psoriatic arthritis who presents with joint pain and rashes now on otezla  30 mg BID and meloxicam  15 mg daily as needed.   He has been taking Otezla , which has caused stomach upset and loose stools. He reports that the psoriasis on his head is getting a bit better and that his ankle has not hurt and is definitely better. However, the appearance of his toes has not improved, though the pain has decreased.  He reports a rash in the groin and gluteal area that has dried up and partially resolved. He no longer experiences painful lumps in these areas.  He has been experiencing hip pain for the past three weeks, which occurs when walking or sitting and is severe enough to require him to pause. The pain does not radiate down the leg or up the back.  He experiences gagging when taking Otezla  and reports that his stomach hurts, with bowel movements being more frequent and gel-like. He does not typically eat before taking the medication in the morning.  No recent viral illnesses. No pain radiating down the leg or up the back.       Previous HPI 08/03/2023 Frank Valerie Raddle. is a 23 year old male with psoriatic arthritis who presents with joint pain and rashes, taking meloxicam  15 mg daily as needed.   He has been experiencing joint pain primarily in his ankle, especially when raising his foot, although it had subsided for a while. He also notes persistent swelling in his toes,  describing them as 'sausage-like'. The joint pain and swelling have persisted despite discontinuation of methotrexate  and the use of anti-inflammatory medications as needed.   The rashes, particularly on his scalp, have not improved and may be getting larger. The largest area of rash remains in his gluteal area. He has also noticed some swelling or nodule on the left side. He denies any new rashes on his knees or other areas. He also reports acne behind his ears, which he refers to as 'a lot of acne'.   He is here with his mother today.   Previous HPI 02/01/2023 Frank Valerie Raddle. is a 23 y.o. male here for follow up for psoriatic arthritis on methotrexate  15 mg p.o. weekly after last visit and folic acid  1 mg daily and meloxicam  15 mg daily as needed.  He presents with persistent rashes and joint swelling. He reports that the rashes improve with the use of topical medication but reappear when the application is discontinued. The patient also reports intermittent knee pain, which has improved recently, possibly due to reduced physical strain and the use of knee pads. He denies any new areas of swelling but notes that the existing swelling in the toes has not completely resolved, although it has decreased slightly. The patient also reports a recent sensation of an impending sore throat, which he  has been managing with cough drops. He denies any other recent illnesses. The patient has been on methotrexate  for several months, but the effectiveness of this medication in controlling his symptoms is unclear.      Previous HPI 11/02/2022 Frank Valerie Raddle. is a 23 y.o. male here for follow up for psoriatic arthritis on methotrexate  15 mg p.o. weekly after last visit and folic acid  1 mg daily and meloxicam  15 mg daily as needed.  Toe pain and swelling is partially improved still keeps diffuse swelling throughout multiple toes on the right foot and a little bit on the left second toe.   Ingrown toenails retreated with podiatry no new complications.  He continues having some active skin rash around the gluteal cleft uses topical triamcinolone  estimates about twice weekly.  Also has a new area of rash with dryness and flaking on the top of his head.  Tried using over-the-counter Selsun Blue shampoo without benefit.  Worse joint pain is in the knees this has been a problem throughout but currently getting more pain in the front of the knee worsened by kneeling.   Previous HPI 08/02/2022 Frank Valerie Raddle. is a 23 y.o. male here for follow up for psoriatic arthritis on methotrexate  increased to 20 mg p.o. weekly after last visit and folic acid  1 mg daily and meloxicam  15 mg daily as needed.  Reports joint pain is doing somewhat better but he still has persistent swelling affecting some toes on the right foot.  Also having trouble with ingrown toenails.  Skin rash remains in groin area no definite change with the medication increase.   Previous HPI 05/30/22 Frank Valerie Raddle. is a 23 y.o. male here for follow up for psoriatic arthritis after starting methotrexate  15mg  PO weekly and folic acid  1 mg daily and meloxicam  15 mg as needed. So far rash is less erythematous but still remains. Toe pain and swelling persists, noticing ingrown toenails in multiple digits due to the swelling. He feels fatigued for 1 day after taking each methotrexate  dose but otherwise tolerating okay.   Previous HPI 04/19/22 Frank Valerie Raddle. is a 23 y.o. male here for follow up for what appears to be psoriatic arthritis.  Still with ongoing pain and joint swelling affecting toes and his right foot.  Application of the Kenalog  on affected area has slightly decreased the scaling and itchiness but the area remains irritated and red coloration has not improved.  The meloxicam  is partially helpful for his arthritis.   Previous HPI 03/28/22 Frank Valerie Raddle. is a 23 y.o. male  here for evaluation of pain and swelling in the right foot ongoing for almost 1 year.  He does not recall any preceding injury or events before the start of this swelling.  Initially there was pain then visible swelling and now has some discoloration on the top of the foot and at least 3 or 4 toes are affected.  Was treated with a prednisone  taper without a very significant difference in symptoms.  He saw Dr. Cleatrice for evaluation with mostly unremarkable laboratory workup besides very slight sed rate elevation of 19.  MRI of the right foot obtained in November demonstrated joint effusions and synovitis in the second through fourth MTP joint and second and third IP joint in the right foot.  He has been taking the meloxicam  15 mg daily at this significantly improved his pain and stiffness but he does not see a change in the amount of swelling.  More recently he started  having some swelling in the second toe of the left foot now he is having some left knee pain and feeling of pressure or difficulty fully flexing the knee.  This has been going on for the past month or 2.  The left knee pain is also improved with the meloxicam  he does not see visible swelling. He has no previous history of any major joint injury or joint surgery.  He does fairly demanding work Engineer, structural. He has noticed new skin rash in the gluteal cleft and inguinal fold areas ongoing for most of the past year as well.  This started after his foot swelling but ongoing for several months at least.  He does not feel any pain or itching just notices the area due to the location and visible discoloration.  He has not tried any specific treatment for this.  No skin rashes elsewhere on the body. Denies any diarrhea or blood in stools.  He has not noticed any eye redness, irritation, or vision changes.   Labs reviewed 11/2021 ANA neg RF neg ESR 19 CRP 3   12/18/21 MRI Right foot IMPRESSION: 1. Second through fourth MTP joint and second and  third IP joint effusions with mild synovitis. Findings are nonspecific, but can be seen in the setting of inflammatory arthropathy. 2. No acute osseous abnormality.   Review of Systems  Constitutional:  Negative for fatigue.  HENT:  Negative for mouth sores and mouth dryness.   Eyes:  Negative for dryness.  Respiratory:  Negative for shortness of breath.   Cardiovascular:  Negative for chest pain and palpitations.  Gastrointestinal:  Positive for diarrhea. Negative for blood in stool and constipation.  Endocrine: Negative for increased urination.  Genitourinary:  Negative for involuntary urination.  Musculoskeletal:  Positive for joint pain, gait problem, joint pain, joint swelling and morning stiffness. Negative for myalgias, muscle weakness, muscle tenderness and myalgias.  Skin:  Positive for rash. Negative for color change, hair loss and sensitivity to sunlight.  Allergic/Immunologic: Negative for susceptible to infections.  Neurological:  Negative for dizziness and headaches.  Hematological:  Negative for swollen glands.  Psychiatric/Behavioral:  Negative for depressed mood and sleep disturbance. The patient is not nervous/anxious.     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   Acne vulgaris 08/21/2014   Nevus of face 08/21/2014    Past Medical History:  Diagnosis Date   Psoriatic arthritis (HCC)     Family History  Problem Relation Age of Onset   Hyperlipidemia Mother    Past Surgical History:  Procedure Laterality Date   WISDOM TOOTH EXTRACTION     Social History   Social History Narrative   ** Merged History Encounter **       Immunization History  Administered Date(s) Administered   DTaP  09/06/2000, 10/26/2000, 02/03/2001, 05/22/2002, 08/10/2004   HIB (PRP-OMP) 09/06/2000, 10/26/2000, 02/03/2001, 10/24/2001   HPV 9-valent 06/04/2014, 08/20/2014, 03/16/2016   Hepatitis A 09/22/2005, 11/13/2007   Hepatitis B Mar 08, 2000, 09/06/2000, 02/08/2001   IPV 09/06/2000, 10/26/2000, 10/24/2001, 08/10/2004   Influenza,inj,Quad PF,6+ Mos 06/04/2014, 03/16/2016   Influenza-Unspecified 04/01/2005, 11/13/2007, 01/14/2009   MMR 10/24/2001, 08/10/2004   Meningococcal Conjugate 06/04/2014   PFIZER(Purple Top)SARS-COV-2 Vaccination 05/22/2019, 06/16/2019   Pneumococcal Conjugate-13 09/06/2000,  10/26/2000, 10/24/2001   Td 09/25/2011   Tdap 09/25/2011   Varicella 10/24/2001, 09/22/2005     Objective: Vital Signs: BP 131/77   Pulse 70   Temp 97.8 F (36.6 C)   Resp 16   Ht 5' 7 (1.702 m)   Wt 189 lb (85.7 kg)   BMI 29.60 kg/m    Physical Exam Eyes:     Conjunctiva/sclera: Conjunctivae normal.  Cardiovascular:     Rate and Rhythm: Normal rate and regular rhythm.  Pulmonary:     Effort: Pulmonary effort is normal.     Breath sounds: Normal breath sounds.  Lymphadenopathy:     Cervical: No cervical adenopathy.  Skin:    General: Skin is warm and dry.     Comments: Gluteal cleft well demarcated erythematous patch  Neurological:     Mental Status: He is alert.  Psychiatric:        Mood and Affect: Mood normal.      Musculoskeletal Exam:  Shoulders full ROM no tenderness or swelling Elbows full ROM no tenderness or swelling Wrists full ROM no tenderness or swelling Fingers full ROM no tenderness or swelling Hip normal internal and external rotation without pain, no tenderness to lateral hip palpation Knees full ROM no tenderness or swelling Ankles full ROM no tenderness or swelling Right 2nd-4th toes chronic widening, no tenderness to pressure  Investigation: No additional findings.  Imaging: No results found.  Recent Labs: Lab Results  Component Value Date   WBC  5.2 08/03/2023   HGB 13.0 (L) 08/03/2023   PLT 311 08/03/2023   NA 138 08/03/2023   K 4.2 08/03/2023   CL 103 08/03/2023   CO2 25 08/03/2023   GLUCOSE 107 (H) 08/03/2023   BUN 10 08/03/2023   CREATININE 0.61 08/03/2023   BILITOT 0.3 08/03/2023   ALKPHOS 95 11/02/2022   AST 22 08/03/2023   ALT 29 08/03/2023   PROT 7.4 08/03/2023   ALBUMIN 4.1 11/02/2022   CALCIUM 9.5 08/03/2023   QFTBGOLDPLUS NEGATIVE 08/03/2023    Speciality Comments: No specialty comments available.  Procedures:  No procedures performed Allergies: Patient has no known allergies.   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 Ankle swelling decreased, gluteal rash mostly resolved. Toe appearance unchanged, pain reduced. Psoriasis on the head improving. Discussed potential for winter exacerbation and long-term treatment options, including Cosentyx or Tremfya. - Continue Otezla  30 mg BID - Monitor for symptom worsening, especially in winter. - Follow up in six months or sooner if issues arise. - Consider switching to injectable therapy if gastrointestinal symptoms become intolerable. - Discussed injectable options: Cosentyx (monthly) or Tremfya (every eight weeks).  High risk medication use - Plan: Apremilast  30 MG TABS Adverse gastrointestinal effects of apremilast  (Otezla ) therapy Experiencing tolerable gastrointestinal side effects from Otezla , including stomach upset and loose stools. Eating before medication may alleviate symptoms. - Try eating before taking Otezla  to reduce gastrointestinal symptoms.  Right hip and pelvic girdle myofascial pain Right hip and pelvic girdle pain likely muscular, possibly related to posture or use. Pain localized to the side of the hip, not involving the joint or sacroiliac region. Likely unrelated to psoriatic arthritis. -  Recommend stretching exercises targeting the affected area. - Use meloxicam  or ibuprofen  as needed for pain. - Provide printout with specific stretches. - Consider physical therapy if symptoms persist. - Consider  muscle relaxer at night if no improvement.        Orders: No orders of the defined types were placed in this encounter.  Meds ordered this encounter  Medications   Apremilast  30 MG TABS    Sig: Take 1 tablet (30 mg total) by mouth 2 (two) times daily with a meal.    Dispense:  180 tablet    Refill:  1    Maintenance dose     Follow-Up Instructions: Return in about 6 months (around 05/01/2024) for PsA on otezla  f/u 6mos.   Lonni LELON Ester, MD  Note - This record has been created using [employer].  Chart creation errors have been sought, but may not always  have been located. Such creation errors do not reflect on  the standard of medical care.

## 2023-11-02 ENCOUNTER — Encounter: Payer: Self-pay | Admitting: Internal Medicine

## 2023-11-02 ENCOUNTER — Other Ambulatory Visit: Payer: Self-pay

## 2023-11-02 ENCOUNTER — Ambulatory Visit: Attending: Internal Medicine | Admitting: Internal Medicine

## 2023-11-02 VITALS — BP 131/77 | HR 70 | Temp 97.8°F | Resp 16 | Ht 67.0 in | Wt 189.0 lb

## 2023-11-02 DIAGNOSIS — Z79899 Other long term (current) drug therapy: Secondary | ICD-10-CM | POA: Diagnosis present

## 2023-11-02 DIAGNOSIS — B350 Tinea barbae and tinea capitis: Secondary | ICD-10-CM | POA: Diagnosis present

## 2023-11-02 DIAGNOSIS — R21 Rash and other nonspecific skin eruption: Secondary | ICD-10-CM | POA: Insufficient documentation

## 2023-11-02 DIAGNOSIS — L4050 Arthropathic psoriasis, unspecified: Secondary | ICD-10-CM | POA: Insufficient documentation

## 2023-11-02 MED ORDER — APREMILAST 30 MG PO TABS
30.0000 mg | ORAL_TABLET | Freq: Two times a day (BID) | ORAL | 1 refills | Status: DC
  Filled 2023-11-02 – 2023-12-04 (×2): qty 180, 90d supply, fill #0

## 2023-11-22 NOTE — Progress Notes (Signed)
 Patient starting Otezla  for PsA/PsO as monotherapy.  Naive to small-molecular drugs for conditions but counseled by Dr. Jeannetta regarding side effects and treamtent goals.  Sherry Pennant, PharmD, MPH, BCPS, CPP Clinical Pharmacist [facility])

## 2023-12-04 ENCOUNTER — Other Ambulatory Visit: Payer: Self-pay

## 2023-12-06 ENCOUNTER — Other Ambulatory Visit (HOSPITAL_COMMUNITY): Payer: Self-pay

## 2024-02-27 ENCOUNTER — Ambulatory Visit: Attending: Internal Medicine | Admitting: Internal Medicine

## 2024-02-27 ENCOUNTER — Encounter: Payer: Self-pay | Admitting: Internal Medicine

## 2024-02-27 VITALS — BP 111/68 | HR 93 | Temp 98.0°F | Resp 16 | Ht 67.0 in | Wt 184.2 lb

## 2024-02-27 DIAGNOSIS — N5089 Other specified disorders of the male genital organs: Secondary | ICD-10-CM | POA: Diagnosis present

## 2024-02-27 DIAGNOSIS — B350 Tinea barbae and tinea capitis: Secondary | ICD-10-CM | POA: Insufficient documentation

## 2024-02-27 DIAGNOSIS — Z79899 Other long term (current) drug therapy: Secondary | ICD-10-CM | POA: Diagnosis present

## 2024-02-27 DIAGNOSIS — L4050 Arthropathic psoriasis, unspecified: Secondary | ICD-10-CM | POA: Diagnosis present

## 2024-02-27 MED ORDER — PREDNISONE 5 MG PO TABS: ORAL_TABLET | ORAL | 0 refills | Status: DC

## 2024-02-27 MED ORDER — PREDNISONE 5 MG PO TABS: ORAL_TABLET | ORAL | 0 refills | Status: AC

## 2024-02-27 NOTE — Assessment & Plan Note (Addendum)
 Cyst likely exacerbated by inflammation. No intervention needed unless symptoms worsen. - Recommended warm Epsom salt baths. - Suggested donut pillow for perineal pressure relief.

## 2024-02-27 NOTE — Assessment & Plan Note (Addendum)
 Adverse effect of Otezla  Gastrointestinal side effects impacting quality of life. Switching to Cosentyx  to avoid these effects. Discussed risks and benefits of cosentyx  including injection reactions, infections, lab monitoring. Only preexisting history is the previous tinea capitis releated treatment. - Checking CBC and CMP baseline for medication monitoring new start cosentyx     Orders:   CBC with Differential/Platelet   Comprehensive metabolic panel with GFR

## 2024-02-27 NOTE — Patient Instructions (Signed)
" ° ° ° ° °   COSENTYX  UNOREADY PEN instructions for use (from left to right):   COSENTYX  SENSOREADY PEN instructions for use (from left to right):   Call Cosentyx  Connect if additional injection education is needed: [phone]  Safety: HOLD the dose if you have signs or symptoms of an infection. You can resume once you feel better or back to your baseline. HOLD the dose if you start antibiotics to treat an infection. HOLD the dose for invasive surgeries/procedures. Your surgeon will be able to provide recommendations on when to hold BEFORE and when you are cleared to RESUME.  Injection tips: Store your medication in the fridge (where your milk goes). Allow your medication to reach to room temperature by placing on counter for at least 30 minutes before you inject. Clean the injection area before you inject with an alcohol swab or rubbing alcohol You can SELF-inject in the upper thigh or lower abdomen. Only a caregiver should administer in the back of your arm. Avoid scars, bruises, tender or swollen areas. Alternate injection sites each time. Discard your used medication in a sharps box or a laundry detergent/bleach bottle.  How to manage an injection site reaction: Remember the 5 C's: COUNTER - leave on the counter at least 30 minutes to bring medication to room temperature. This may help prevent stinging COLD - place something cold (like an ice gel pack or cold water  bottle) on the injection site just before cleansing with alcohol. This may help reduce pain CLARITIN - use Claritin (generic name is loratadine) for the first two weeks of treatment or the day of, the day before, and the day after injecting. This will help to minimize injection site reactions CORTISONE CREAM - apply if injection site is irritated and itching CALL OFFICE - if injection site reaction is bigger than the size of your fist, looks infected, blisters, or if you develop hives  "

## 2024-02-27 NOTE — Assessment & Plan Note (Addendum)
 Flare-up with ineffective Otezla  treatment and gastrointestinal side effects. Cosentyx  chosen for efficacy in skin and joint symptoms with faster onset. Discussed Cosentyx  side effects and tuberculosis screening. - Initiated Cosentyx : 300 mg Braggs loading dose then monthly - Continue Otezla  until Cosentyx  starts. - Ordered blood tests for monitoring. - Provided Cosentyx  storage and administration information. Orders:   Sedimentation rate   C-reactive protein   predniSONE  (DELTASONE ) 5 MG tablet; Take 4 tablets (20 mg total) by mouth daily with breakfast for 3 days, THEN 3 tablets (15 mg total) daily with breakfast for 3 days, THEN 2 tablets (10 mg total) daily with breakfast for 3 days, THEN 1 tablet (5 mg total) daily with breakfast for 3 days.

## 2024-02-28 LAB — CBC WITH DIFFERENTIAL/PLATELET
Absolute Lymphocytes: 1887 {cells}/uL (ref 850–3900)
Absolute Monocytes: 575 {cells}/uL (ref 200–950)
Basophils Absolute: 41 {cells}/uL (ref 0–200)
Basophils Relative: 0.5 %
Eosinophils Absolute: 227 {cells}/uL (ref 15–500)
Eosinophils Relative: 2.8 %
HCT: 42.7 % (ref 39.4–51.1)
Hemoglobin: 14 g/dL (ref 13.2–17.1)
MCH: 29 pg (ref 27.0–33.0)
MCHC: 32.8 g/dL (ref 31.6–35.4)
MCV: 88.6 fL (ref 81.4–101.7)
MPV: 10.7 fL (ref 7.5–12.5)
Monocytes Relative: 7.1 %
Neutro Abs: 5370 {cells}/uL (ref 1500–7800)
Neutrophils Relative %: 66.3 %
Platelets: 401 Thousand/uL — ABNORMAL HIGH (ref 140–400)
RBC: 4.82 Million/uL (ref 4.20–5.80)
RDW: 13.1 % (ref 11.0–15.0)
Total Lymphocyte: 23.3 %
WBC: 8.1 Thousand/uL (ref 3.8–10.8)

## 2024-02-28 LAB — COMPREHENSIVE METABOLIC PANEL WITH GFR
AG Ratio: 1.5 (calc) (ref 1.0–2.5)
ALT: 30 U/L (ref 9–46)
AST: 18 U/L (ref 10–40)
Albumin: 4.8 g/dL (ref 3.6–5.1)
Alkaline phosphatase (APISO): 102 U/L (ref 36–130)
BUN/Creatinine Ratio: 25 (calc) — ABNORMAL HIGH (ref 6–22)
BUN: 14 mg/dL (ref 7–25)
CO2: 25 mmol/L (ref 20–32)
Calcium: 9.5 mg/dL (ref 8.6–10.3)
Chloride: 102 mmol/L (ref 98–110)
Creat: 0.56 mg/dL — ABNORMAL LOW (ref 0.60–1.24)
Globulin: 3.2 g/dL (ref 1.9–3.7)
Glucose, Bld: 136 mg/dL — ABNORMAL HIGH (ref 65–99)
Potassium: 3.9 mmol/L (ref 3.5–5.3)
Sodium: 137 mmol/L (ref 135–146)
Total Bilirubin: 0.4 mg/dL (ref 0.2–1.2)
Total Protein: 8 g/dL (ref 6.1–8.1)
eGFR: 142 mL/min/1.73m2

## 2024-02-28 LAB — C-REACTIVE PROTEIN: CRP: 8.6 mg/L — ABNORMAL HIGH

## 2024-02-28 LAB — SEDIMENTATION RATE: Sed Rate: 22 mm/h — ABNORMAL HIGH (ref 0–15)

## 2024-03-14 ENCOUNTER — Other Ambulatory Visit (HOSPITAL_COMMUNITY): Payer: Self-pay

## 2024-03-14 ENCOUNTER — Telehealth: Payer: Self-pay | Admitting: Pharmacist

## 2024-03-14 DIAGNOSIS — L4050 Arthropathic psoriasis, unspecified: Secondary | ICD-10-CM

## 2024-03-14 DIAGNOSIS — Z79899 Other long term (current) drug therapy: Secondary | ICD-10-CM

## 2024-03-14 NOTE — Telephone Encounter (Addendum)
 Submitted a Prior Authorization request to [facility] MEDICAID for COSENTYX  SQ via CoverMyMeds. Will update once we receive a response.  Key: [id number]    ----- Message from Lonni Ester, MD sent at 03/14/2024  5:04 PM EST ----- Need to start BIV for [place] cosentyx  due to otezla  ineffective and side effects.  Is RX Rheum Team a new pool we are using now instead of rheum/pulm?

## 2024-03-16 ENCOUNTER — Other Ambulatory Visit (HOSPITAL_COMMUNITY): Payer: Self-pay

## 2024-03-16 MED ORDER — COSENTYX UNOREADY 300 MG/2ML ~~LOC~~ SOAJ
SUBCUTANEOUS | 0 refills | Status: AC
  Filled 2024-03-19: qty 8, 28d supply, fill #0

## 2024-03-16 MED ORDER — COSENTYX UNOREADY 300 MG/2ML ~~LOC~~ SOAJ
SUBCUTANEOUS | 1 refills | Status: AC
  Filled 2024-03-19: qty 2, fill #0

## 2024-03-16 NOTE — Telephone Encounter (Signed)
 Received notification from [facility] MEDICAID regarding a prior authorization for COSENTYX  SQ. Authorization has been APPROVED from 03/14/2024 to 03/14/2025. Approval letter sent to scan center.  Per test claim, copay for 28 days supply is $4 ( for lodaing dose)  Patient can fill through [facility]: [phone]   Authorization # [id number]  Rx sent to [facility] for onboarding. Alwin will be reaching out to schedule shipment to home.  Sherry Pennant, PharmD, MPH, BCPS, CPP Clinical Pharmacist

## 2024-03-18 ENCOUNTER — Other Ambulatory Visit: Payer: Self-pay

## 2024-03-19 ENCOUNTER — Other Ambulatory Visit: Payer: Self-pay

## 2024-03-19 NOTE — Progress Notes (Signed)
 Specialty Pharmacy Initial Fill Coordination Note  Frank Leon. is a 24 y.o. male contacted today regarding initial fill of specialty medication(s) Secukinumab  (Cosentyx  UnoReady)   Patient requested Delivery   Delivery date: 03/21/24   Verified address: [address]   [place] [address]   Medication will be filled on: 03/20/24   Patient is aware of $4 copayment.

## 2024-03-20 ENCOUNTER — Other Ambulatory Visit: Payer: Self-pay

## 2024-05-02 ENCOUNTER — Ambulatory Visit: Admitting: Internal Medicine

## 2024-05-30 ENCOUNTER — Ambulatory Visit: Admitting: Internal Medicine
